# Patient Record
Sex: Female | Born: 1965 | Race: White | Hispanic: No | Marital: Married | Smoking: Former smoker
Health system: Southern US, Community
[De-identification: ages and names within clinical notes are randomized; demographics above are authoritative.]

---

## 1998-03-20 HISTORY — PX: OTHER SURGICAL HISTORY: SHX169

## 2018-02-19 DIAGNOSIS — Z23 Encounter for immunization: Secondary | ICD-10-CM | POA: Diagnosis not present

## 2018-02-19 DIAGNOSIS — Z1239 Encounter for other screening for malignant neoplasm of breast: Secondary | ICD-10-CM | POA: Diagnosis not present

## 2018-02-19 DIAGNOSIS — Z0000 Encounter for general adult medical examination without abnormal findings: Secondary | ICD-10-CM | POA: Diagnosis not present

## 2018-03-25 DIAGNOSIS — Z1231 Encounter for screening mammogram for malignant neoplasm of breast: Secondary | ICD-10-CM | POA: Diagnosis not present

## 2018-03-25 DIAGNOSIS — Z1239 Encounter for other screening for malignant neoplasm of breast: Secondary | ICD-10-CM | POA: Diagnosis not present

## 2018-05-31 DIAGNOSIS — Z23 Encounter for immunization: Secondary | ICD-10-CM | POA: Diagnosis not present

## 2018-12-25 ENCOUNTER — Encounter: Payer: Self-pay | Admitting: Family Medicine

## 2018-12-25 ENCOUNTER — Ambulatory Visit (INDEPENDENT_AMBULATORY_CARE_PROVIDER_SITE_OTHER): Payer: BC Managed Care – PPO | Admitting: Family Medicine

## 2018-12-25 ENCOUNTER — Other Ambulatory Visit: Payer: Self-pay

## 2018-12-25 VITALS — BP 128/85 | HR 81 | Temp 98.0°F | Ht 64.5 in | Wt 250.8 lb

## 2018-12-25 DIAGNOSIS — R7303 Prediabetes: Secondary | ICD-10-CM | POA: Insufficient documentation

## 2018-12-25 DIAGNOSIS — Z0000 Encounter for general adult medical examination without abnormal findings: Secondary | ICD-10-CM

## 2018-12-25 DIAGNOSIS — M2041 Other hammer toe(s) (acquired), right foot: Secondary | ICD-10-CM | POA: Insufficient documentation

## 2018-12-25 DIAGNOSIS — Z803 Family history of malignant neoplasm of breast: Secondary | ICD-10-CM | POA: Insufficient documentation

## 2018-12-25 DIAGNOSIS — Z23 Encounter for immunization: Secondary | ICD-10-CM

## 2018-12-25 DIAGNOSIS — M2042 Other hammer toe(s) (acquired), left foot: Secondary | ICD-10-CM | POA: Insufficient documentation

## 2018-12-25 LAB — CBC WITH DIFFERENTIAL/PLATELET
Basophils Absolute: 0.1 10*3/uL (ref 0.0–0.1)
Basophils Relative: 1 % (ref 0.0–3.0)
Eosinophils Absolute: 0.1 10*3/uL (ref 0.0–0.7)
Eosinophils Relative: 1.8 % (ref 0.0–5.0)
HCT: 42.6 % (ref 36.0–46.0)
Hemoglobin: 14.1 g/dL (ref 12.0–15.0)
Lymphocytes Relative: 29 % (ref 12.0–46.0)
Lymphs Abs: 1.8 10*3/uL (ref 0.7–4.0)
MCHC: 33.2 g/dL (ref 30.0–36.0)
MCV: 91.9 fl (ref 78.0–100.0)
Monocytes Absolute: 0.4 10*3/uL (ref 0.1–1.0)
Monocytes Relative: 6 % (ref 3.0–12.0)
Neutro Abs: 3.9 10*3/uL (ref 1.4–7.7)
Neutrophils Relative %: 62.2 % (ref 43.0–77.0)
Platelets: 205 10*3/uL (ref 150.0–400.0)
RBC: 4.64 Mil/uL (ref 3.87–5.11)
RDW: 13.8 % (ref 11.5–15.5)
WBC: 6.2 10*3/uL (ref 4.0–10.5)

## 2018-12-25 LAB — LIPID PANEL
Cholesterol: 181 mg/dL (ref 0–200)
HDL: 62.3 mg/dL (ref >39.00)
LDL Cholesterol: 102 mg/dL — ABNORMAL HIGH (ref 0–99)
NonHDL: 118.51
Total CHOL/HDL Ratio: 3
Triglycerides: 82 mg/dL (ref 0.0–149.0)
VLDL: 16.4 mg/dL (ref 0.0–40.0)

## 2018-12-25 LAB — HEMOGLOBIN A1C: Hgb A1c MFr Bld: 5.9 % (ref 4.6–6.5)

## 2018-12-25 LAB — COMPREHENSIVE METABOLIC PANEL
ALT: 20 U/L (ref 0–35)
AST: 12 U/L (ref 0–37)
Albumin: 4.4 g/dL (ref 3.5–5.2)
Alkaline Phosphatase: 59 U/L (ref 39–117)
BUN: 17 mg/dL (ref 6–23)
CO2: 28 mEq/L (ref 19–32)
Calcium: 9.3 mg/dL (ref 8.4–10.5)
Chloride: 106 mEq/L (ref 96–112)
Creatinine, Ser: 0.87 mg/dL (ref 0.40–1.20)
GFR: 67.55 mL/min (ref >60.00)
Glucose, Bld: 96 mg/dL (ref 70–99)
Potassium: 4 mEq/L (ref 3.5–5.1)
Sodium: 142 mEq/L (ref 135–145)
Total Bilirubin: 0.4 mg/dL (ref 0.2–1.2)
Total Protein: 7 g/dL (ref 6.0–8.3)

## 2018-12-25 LAB — VITAMIN D 25 HYDROXY (VIT D DEFICIENCY, FRACTURES): VITD: 26.54 ng/mL — ABNORMAL LOW (ref 30.00–100.00)

## 2018-12-25 LAB — TSH: TSH: 1.88 u[IU]/mL (ref 0.35–4.50)

## 2018-12-25 NOTE — Patient Instructions (Signed)
Prediabetes Eating Plan Prediabetes is a condition that causes blood sugar (glucose) levels to be higher than normal. This increases the risk for developing diabetes. In order to prevent diabetes from developing, your health care provider may recommend a diet and other lifestyle changes to help you:  Control your blood glucose levels.  Improve your cholesterol levels.  Manage your blood pressure. Your health care provider may recommend working with a diet and nutrition specialist (dietitian) to make a meal plan that is best for you. What are tips for following this plan? Lifestyle  Set weight loss goals with the help of your health care team. It is recommended that most people with prediabetes lose 7% of their current body weight.  Exercise for at least 30 minutes at least 5 days a week.  Attend a support group or seek ongoing support from a mental health counselor.  Take over-the-counter and prescription medicines only as told by your health care provider. Reading food labels  Read food labels to check the amount of fat, salt (sodium), and sugar in prepackaged foods. Avoid foods that have: ? Saturated fats. ? Trans fats. ? Added sugars.  Avoid foods that have more than 300 milligrams (mg) of sodium per serving. Limit your daily sodium intake to less than 2,300 mg each day. Shopping  Avoid buying pre-made and processed foods. Cooking  Cook with olive oil. Do not use butter, lard, or ghee.  Bake, broil, grill, or boil foods. Avoid frying. Meal planning   Work with your dietitian to develop an eating plan that is right for you. This may include: ? Tracking how many calories you take in. Use a food diary, notebook, or mobile application to track what you eat at each meal. ? Using the glycemic index (GI) to plan your meals. The index tells you how quickly a food will raise your blood glucose. Choose low-GI foods. These foods take a longer time to raise blood  glucose.  Consider following a Mediterranean diet. This diet includes: ? Several servings each day of fresh fruits and vegetables. ? Eating fish at least twice a week. ? Several servings each day of whole grains, beans, nuts, and seeds. ? Using olive oil instead of other fats. ? Moderate alcohol consumption. ? Eating small amounts of red meat and whole-fat dairy.  If you have high blood pressure, you may need to limit your sodium intake or follow a diet such as the DASH eating plan. DASH is an eating plan that aims to lower high blood pressure. What foods are recommended? The items listed below may not be a complete list. Talk with your dietitian about what dietary choices are best for you. Grains Whole grains, such as whole-wheat or whole-grain breads, crackers, cereals, and pasta. Unsweetened oatmeal. Bulgur. Barley. Quinoa. Brown rice. Corn or whole-wheat flour tortillas or taco shells. Vegetables Lettuce. Spinach. Peas. Beets. Cauliflower. Cabbage. Broccoli. Carrots. Tomatoes. Squash. Eggplant. Herbs. Peppers. Onions. Cucumbers. Brussels sprouts. Fruits Berries. Bananas. Apples. Oranges. Grapes. Papaya. Mango. Pomegranate. Kiwi. Grapefruit. Cherries. Meats and other protein foods Seafood. Poultry without skin. Lean cuts of pork and beef. Tofu. Eggs. Nuts. Beans. Dairy Low-fat or fat-free dairy products, such as yogurt, cottage cheese, and cheese. Beverages Water. Tea. Coffee. Sugar-free or diet soda. Seltzer water. Lowfat or no-fat milk. Milk alternatives, such as soy or almond milk. Fats and oils Olive oil. Canola oil. Sunflower oil. Grapeseed oil. Avocado. Walnuts. Sweets and desserts Sugar-free or low-fat pudding. Sugar-free or low-fat ice cream and other frozen  treats. Seasoning and other foods Herbs. Sodium-free spices. Mustard. Relish. Low-fat, low-sugar ketchup. Low-fat, low-sugar barbecue sauce. Low-fat or fat-free mayonnaise. What foods are not recommended? The items  listed below may not be a complete list. Talk with your dietitian about what dietary choices are best for you. Grains Refined white flour and flour products, such as bread, pasta, snack foods, and cereals. Vegetables Canned vegetables. Frozen vegetables with butter or cream sauce. Fruits Fruits canned with syrup. Meats and other protein foods Fatty cuts of meat. Poultry with skin. Breaded or fried meat. Processed meats. Dairy Full-fat yogurt, cheese, or milk. Beverages Sweetened drinks, such as sweet iced tea and soda. Fats and oils Butter. Lard. Ghee. Sweets and desserts Baked goods, such as cake, cupcakes, pastries, cookies, and cheesecake. Seasoning and other foods Spice mixes with added salt. Ketchup. Barbecue sauce. Mayonnaise. Summary  To prevent diabetes from developing, you may need to make diet and other lifestyle changes to help control blood sugar, improve cholesterol levels, and manage your blood pressure.  Set weight loss goals with the help of your health care team. It is recommended that most people with prediabetes lose 7 percent of their current body weight.  Consider following a Mediterranean diet that includes plenty of fresh fruits and vegetables, whole grains, beans, nuts, seeds, fish, lean meat, low-fat dairy, and healthy oils. This information is not intended to replace advice given to you by your health care provider. Make sure you discuss any questions you have with your health care provider. Document Released: 07/21/2014 Document Revised: 06/28/2018 Document Reviewed: 05/10/2016 Elsevier Patient Education  2020 [employer]  Preventive Care 44-45 Years Old, Female Preventive care refers to visits with your health care provider and lifestyle choices that can promote health and wellness. This includes:  A yearly physical exam. This may also be called an annual well check.  Regular dental visits and eye exams.  Immunizations.  Screening for certain  conditions.  Healthy lifestyle choices, such as eating a healthy diet, getting regular exercise, not using drugs or products that contain nicotine and tobacco, and limiting alcohol use. What can I expect for my preventive care visit? Physical exam Your health care provider will check your:  Height and weight. This may be used to calculate body mass index (BMI), which tells if you are at a healthy weight.  Heart rate and blood pressure.  Skin for abnormal spots. Counseling Your health care provider may ask you questions about your:  Alcohol, tobacco, and drug use.  Emotional well-being.  Home and relationship well-being.  Sexual activity.  Eating habits.  Work and work Statistician.  Method of birth control.  Menstrual cycle.  Pregnancy history. What immunizations do I need?  Influenza (flu) vaccine  This is recommended every year. Tetanus, diphtheria, and pertussis (Tdap) vaccine  You may need a Td booster every 10 years. Varicella (chickenpox) vaccine  You may need this if you have not been vaccinated. Zoster (shingles) vaccine  You may need this after age 53. Measles, mumps, and rubella (MMR) vaccine  You may need at least one dose of MMR if you were born in 1957 or later. You may also need a second dose. Pneumococcal conjugate (PCV13) vaccine  You may need this if you have certain conditions and were not previously vaccinated. Pneumococcal polysaccharide (PPSV23) vaccine  You may need one or two doses if you smoke cigarettes or if you have certain conditions. Meningococcal conjugate (MenACWY) vaccine  You may need this if you have certain  conditions. Hepatitis A vaccine  You may need this if you have certain conditions or if you travel or work in places where you may be exposed to hepatitis A. Hepatitis B vaccine  You may need this if you have certain conditions or if you travel or work in places where you may be exposed to hepatitis B. Haemophilus  influenzae type b (Hib) vaccine  You may need this if you have certain conditions. Human papillomavirus (HPV) vaccine  If recommended by your health care provider, you may need three doses over 6 months. You may receive vaccines as individual doses or as more than one vaccine together in one shot (combination vaccines). Talk with your health care provider about the risks and benefits of combination vaccines. What tests do I need? Blood tests  Lipid and cholesterol levels. These may be checked every 5 years, or more frequently if you are over 1 years old.  Hepatitis C test.  Hepatitis B test. Screening  Lung cancer screening. You may have this screening every year starting at age 54 if you have a 30-pack-year history of smoking and currently smoke or have quit within the past 15 years.  Colorectal cancer screening. All adults should have this screening starting at age 43 and continuing until age 37. Your health care provider may recommend screening at age 67 if you are at increased risk. You will have tests every 1-10 years, depending on your results and the type of screening test.  Diabetes screening. This is done by checking your blood sugar (glucose) after you have not eaten for a while (fasting). You may have this done every 1-3 years.  Mammogram. This may be done every 1-2 years. Talk with your health care provider about when you should start having regular mammograms. This may depend on whether you have a family history of breast cancer.  BRCA-related cancer screening. This may be done if you have a family history of breast, ovarian, tubal, or peritoneal cancers.  Pelvic exam and Pap test. This may be done every 3 years starting at age 79. Starting at age 42, this may be done every 5 years if you have a Pap test in combination with an HPV test. Other tests  Sexually transmitted disease (STD) testing.  Bone density scan. This is done to screen for osteoporosis. You may have this  scan if you are at high risk for osteoporosis. Follow these instructions at home: Eating and drinking  Eat a diet that includes fresh fruits and vegetables, whole grains, lean protein, and low-fat dairy.  Take vitamin and mineral supplements as recommended by your health care provider.  Do not drink alcohol if: ? Your health care provider tells you not to drink. ? You are pregnant, may be pregnant, or are planning to become pregnant.  If you drink alcohol: ? Limit how much you have to 0-1 drink a day. ? Be aware of how much alcohol is in your drink. In the [place] one drink equals one 12 oz bottle of beer (355 mL), one 5 oz glass of wine (148 mL), or one 1 oz glass of hard liquor (44 mL). Lifestyle  Take daily care of your teeth and gums.  Stay active. Exercise for at least 30 minutes on 5 or more days each week.  Do not use any products that contain nicotine or tobacco, such as cigarettes, e-cigarettes, and chewing tobacco. If you need help quitting, ask your health care provider.  If you are sexually active, practice safe  sex. Use a condom or other form of birth control (contraception) in order to prevent pregnancy and STIs (sexually transmitted infections).  If told by your health care provider, take low-dose aspirin daily starting at age 61. What's next?  Visit your health care provider once a year for a well check visit.  Ask your health care provider how often you should have your eyes and teeth checked.  Stay up to date on all vaccines. This information is not intended to replace advice given to you by your health care provider. Make sure you discuss any questions you have with your health care provider. Document Released: 04/02/2015 Document Revised: 11/15/2017 Document Reviewed: 11/15/2017 Elsevier Patient Education  2020 [employer].

## 2018-12-25 NOTE — Progress Notes (Signed)
Patient: Susan Eaton MRN: [id number] DOB: 1965/12/15 PCP: Orma Flaming, MD     Subjective:  Chief Complaint  Patient presents with  . Establish Care  . Annual Exam    HPI: The patient is a 53 y.o. female who presents today for annual exam. She denies any changes to past medical history. There have been no recent hospitalizations. They are not following a well balanced diet and exercise plan. Weight has been stable. No complaints today. She is transferring from [place]. Records in our epic. She is menopausal.   Family history of breast cancer in her mother. BRCA negative in patient. No colon cancer.   Immunization History  Administered Date(s) Administered  . Influenza,inj,Quad PF,6+ Mos 01/21/2015, 02/19/2018  . Tdap 01/21/2015  . Zoster Recombinat (Shingrix) 02/19/2018, 05/31/2018    Colonoscopy: 07/12/2015. F/u in 10 years.  Mammogram: utd. 02/2018 Pap smear: looking into this. See 2016 in her other notes.  utd tdap and shingrix. Flu shot today.    Review of Systems  Constitutional: Negative for chills, fatigue and fever.  HENT: Negative for congestion, dental problem, ear pain, hearing loss, postnasal drip, rhinorrhea, sore throat and trouble swallowing.   Eyes: Negative for visual disturbance.  Respiratory: Negative for cough, chest tightness and shortness of breath.   Cardiovascular: Negative for chest pain, palpitations and leg swelling.  Gastrointestinal: Negative for abdominal pain, blood in stool, diarrhea, nausea and vomiting.  Endocrine: Negative for cold intolerance, polydipsia, polyphagia and polyuria.  Genitourinary: Negative for dysuria, hematuria, pelvic pain, urgency and vaginal pain.  Musculoskeletal: Negative for arthralgias, back pain, myalgias and neck pain.  Skin: Negative for rash.  Neurological: Negative for dizziness and headaches.  Psychiatric/Behavioral: Negative for dysphoric mood and sleep disturbance. The patient is not nervous/anxious.      Allergies Patient has No Known Allergies.  Past Medical History Patient  has no past medical history on file.  Surgical History Patient  has a past surgical history that includes prolapsed colon (2000).  Family History Pateint's family history is not on file.  Social History Patient  reports that she has quit smoking. She has never used smokeless tobacco. She reports current alcohol use. She reports that she does not use drugs.    Objective: Vitals:   12/25/18 1321  BP: 128/85  Pulse: 81  Temp: 98 F (36.7 C)  TempSrc: Skin  SpO2: 98%  Weight: 250 lb 12.8 oz (113.8 kg)  Height: 5' 4.5" (1.638 m)    Body mass index is 42.38 kg/m.  Physical Exam Vitals signs reviewed.  Constitutional:      Appearance: Normal appearance. She is well-developed. She is obese.  HENT:     Head: Normocephalic and atraumatic.     Right Ear: Tympanic membrane, ear canal and external ear normal.     Left Ear: Tympanic membrane, ear canal and external ear normal.     Nose: Nose normal.     Mouth/Throat:     Mouth: Mucous membranes are moist.  Eyes:     Extraocular Movements: Extraocular movements intact.     Conjunctiva/sclera: Conjunctivae normal.     Pupils: Pupils are equal, round, and reactive to light.  Neck:     Musculoskeletal: Normal range of motion and neck supple.     Thyroid: No thyromegaly.  Cardiovascular:     Rate and Rhythm: Normal rate and regular rhythm.     Pulses: Normal pulses.     Heart sounds: Normal heart sounds. No murmur.  Pulmonary:  Effort: Pulmonary effort is normal.     Breath sounds: Normal breath sounds.  Abdominal:     General: Bowel sounds are normal. There is no distension.     Palpations: Abdomen is soft.     Tenderness: There is no abdominal tenderness.  Lymphadenopathy:     Cervical: No cervical adenopathy.  Skin:    General: Skin is warm and dry.     Capillary Refill: Capillary refill takes less than 2 seconds.     Findings: No rash.   Neurological:     General: No focal deficit present.     Mental Status: She is alert and oriented to person, place, and time.     Cranial Nerves: No cranial nerve deficit.     Coordination: Coordination normal.     Deep Tendon Reflexes: Reflexes normal.  Psychiatric:        Mood and Affect: Mood normal.        Behavior: Behavior normal.        Depression screen PHQ 2/9 12/25/2018  Decreased Interest 0  Down, Depressed, Hopeless 0  PHQ - 2 Score 0    Assessment/plan: 1. Annual physical exam UTD on HM. Giving flu shot today. Asked her to check her pap smear and see if she needs this done and we can do this here. Recommended healthy diet and weight loss. Watch diastolic bp. F/u in one year or as needed. Records are in chart. Need mmg, requesting these.  Patient counseling '[x]'    Nutrition: Stressed importance of moderation in sodium/caffeine intake, saturated fat and cholesterol, caloric balance, sufficient intake of fresh fruits, vegetables, fiber, calcium, iron, and 1 mg of folate supplement per day (for females capable of pregnancy).  '[x]'    Stressed the importance of regular exercise.   '[]'    Substance Abuse: Discussed cessation/primary prevention of tobacco, alcohol, or other drug use; driving or other dangerous activities under the influence; availability of treatment for abuse.   '[x]'    Injury prevention: Discussed safety belts, safety helmets, smoke detector, smoking near bedding or upholstery.   '[x]'    Sexuality: Discussed sexually transmitted diseases, partner selection, use of condoms, avoidance of unintended pregnancy  and contraceptive alternatives.  '[x]'    Dental health: Discussed importance of regular tooth brushing, flossing, and dental visits.  '[x]'    Health maintenance and immunizations reviewed. Please refer to Health maintenance section.    - Comprehensive metabolic panel - CBC with Differential/Platelet - TSH - Lipid panel - VITAMIN D 25 Hydroxy (Vit-D Deficiency,  Fractures)  2. Prediabetes  - Hemoglobin A1c  -flu shot.   Return in about 6 months (around 06/25/2019) for pre diabetes checkup .     Orma Flaming, MD [place]  12/25/2018

## 2018-12-26 ENCOUNTER — Other Ambulatory Visit: Payer: Self-pay | Admitting: Family Medicine

## 2018-12-26 DIAGNOSIS — E559 Vitamin D deficiency, unspecified: Secondary | ICD-10-CM | POA: Insufficient documentation

## 2018-12-26 MED ORDER — VITAMIN D (ERGOCALCIFEROL) 1.25 MG (50000 UNIT) PO CAPS: ORAL_CAPSULE | ORAL | 0 refills | Status: DC

## 2019-02-18 ENCOUNTER — Other Ambulatory Visit: Payer: Self-pay | Admitting: Family Medicine

## 2019-11-05 ENCOUNTER — Encounter: Payer: Self-pay | Admitting: Family Medicine

## 2019-11-05 ENCOUNTER — Other Ambulatory Visit: Payer: Self-pay | Admitting: Family Medicine

## 2019-11-05 ENCOUNTER — Other Ambulatory Visit (HOSPITAL_COMMUNITY)
Admission: RE | Admit: 2019-11-05 | Discharge: 2019-11-05 | Disposition: A | Discharge status: Home / Self Care | Payer: BC Managed Care – PPO | Source: Ambulatory Visit | Attending: Family Medicine | Admitting: Family Medicine

## 2019-11-05 ENCOUNTER — Other Ambulatory Visit: Payer: Self-pay

## 2019-11-05 ENCOUNTER — Ambulatory Visit (INDEPENDENT_AMBULATORY_CARE_PROVIDER_SITE_OTHER): Payer: BC Managed Care – PPO | Admitting: Family Medicine

## 2019-11-05 VITALS — BP 119/76 | HR 83 | Temp 98.1°F | Ht 63.5 in | Wt 261.6 lb

## 2019-11-05 DIAGNOSIS — Z0000 Encounter for general adult medical examination without abnormal findings: Secondary | ICD-10-CM

## 2019-11-05 DIAGNOSIS — E559 Vitamin D deficiency, unspecified: Secondary | ICD-10-CM | POA: Diagnosis not present

## 2019-11-05 DIAGNOSIS — N3941 Urge incontinence: Secondary | ICD-10-CM | POA: Diagnosis not present

## 2019-11-05 DIAGNOSIS — Z1231 Encounter for screening mammogram for malignant neoplasm of breast: Secondary | ICD-10-CM

## 2019-11-05 DIAGNOSIS — Z124 Encounter for screening for malignant neoplasm of cervix: Secondary | ICD-10-CM

## 2019-11-05 DIAGNOSIS — R7303 Prediabetes: Secondary | ICD-10-CM

## 2019-11-05 DIAGNOSIS — E28319 Asymptomatic premature menopause: Secondary | ICD-10-CM

## 2019-11-05 MED ORDER — MIRABEGRON ER 25 MG PO TB24: 25.0000 mg | ORAL_TABLET | Freq: Every day | ORAL | 0 refills | Status: DC

## 2019-11-05 NOTE — Progress Notes (Signed)
Patient: Susan Eaton MRN: [id number] DOB: February 27, 1966 PCP: Orma Flaming, MD     Subjective:  Chief Complaint  Patient presents with  . Annual Exam  . Urinary Urgency    HPI: The patient is a 54 y.o. female who presents today for annual exam. She denies any changes to past medical history. There have been no recent hospitalizations. They are not following a well balanced diet and exercise plan. Weight has been stable. She has bladder control concerns..   Family history of breast cancer in her mother. BRCA negative for patient. No colon cancer.   Due for pap smear. No history of abnormal pap smears or STDs that she is aware of. NO vaginal complaints and denies any discharge, pain with sex. She is sexually active with one person only, her husband. Mmg is due and utd on colonoscopy. She was young with menopause and never did HRT. She thinks she was in her 20s. G2P2  Vaginal deliveries.   She has some bladder concerns. She states she will get to where she has to go to the bathroom and then can't make it there fast enough. She does have very mild stress incontinence. This is more she will be doing things and have no idea she has to go to the bathroom and then all of a sudden she has to go and then leaks on herself.   Immunization History  Administered Date(s) Administered  . Influenza,inj,Quad PF,6+ Mos 01/21/2015, 02/19/2018, 12/25/2018  . PFIZER SARS-COV-2 Vaccination 06/14/2019, 07/02/2019  . Tdap 01/21/2015  . Zoster Recombinat (Shingrix) 02/19/2018, 05/31/2018   Colonoscopy: 07/12/2015 Mammogram: due for this.  Pap smear:  Due for this.    Review of Systems  Respiratory: Negative for shortness of breath.   Cardiovascular: Negative for chest pain and palpitations.  Gastrointestinal: Negative for abdominal pain, diarrhea and nausea.  Genitourinary: Positive for urgency. Negative for frequency and pelvic pain.  Musculoskeletal: Negative for back pain.    Allergies Patient  has No Known Allergies.  Past Medical History Patient  has no past medical history on file.  Surgical History Patient  has a past surgical history that includes prolapsed colon (2000).  Family History Pateint's family history is not on file.  Social History Patient  reports that she has quit smoking. She has never used smokeless tobacco. She reports current alcohol use. She reports that she does not use drugs.    Objective: Vitals:   11/05/19 1355  BP: 119/76  Pulse: 83  Temp: 98.1 F (36.7 C)  TempSrc: Temporal  SpO2: 98%  Weight: 261 lb 9.6 oz (118.7 kg)  Height: 5' 3.5" (1.613 m)    Body mass index is 45.61 kg/m.  Physical Exam Vitals reviewed. Exam conducted with a chaperone present.  Constitutional:      Appearance: Normal appearance. She is well-developed. She is obese.  HENT:     Head: Normocephalic and atraumatic.     Right Ear: Tympanic membrane, ear canal and external ear normal.     Left Ear: Tympanic membrane, ear canal and external ear normal.     Nose: Nose normal.     Mouth/Throat:     Mouth: Mucous membranes are moist.  Eyes:     Extraocular Movements: Extraocular movements intact.     Conjunctiva/sclera: Conjunctivae normal.     Pupils: Pupils are equal, round, and reactive to light.  Neck:     Thyroid: No thyromegaly.     Vascular: No carotid bruit.  Cardiovascular:  Rate and Rhythm: Normal rate and regular rhythm.     Pulses: Normal pulses.     Heart sounds: Normal heart sounds. No murmur heard.   Pulmonary:     Effort: Pulmonary effort is normal.     Breath sounds: Normal breath sounds.  Chest:     Comments: Declined breast exam  Abdominal:     General: Bowel sounds are normal. There is no distension.     Palpations: Abdomen is soft.     Tenderness: There is no abdominal tenderness.  Genitourinary:    General: Normal vulva.     Cervix: Normal.     Uterus: Normal.      Adnexa: Right adnexa normal and left adnexa normal.   Musculoskeletal:     Cervical back: Normal range of motion and neck supple.  Lymphadenopathy:     Cervical: No cervical adenopathy.  Skin:    General: Skin is warm and dry.     Capillary Refill: Capillary refill takes less than 2 seconds.     Findings: No rash.  Neurological:     General: No focal deficit present.     Mental Status: She is alert and oriented to person, place, and time.     Cranial Nerves: No cranial nerve deficit.     Coordination: Coordination normal.     Deep Tendon Reflexes: Reflexes normal.  Psychiatric:        Mood and Affect: Mood normal.        Behavior: Behavior normal.       Office Visit from 11/05/2019 in [place]  PHQ-2 Total Score 0         Assessment/plan: 1. Annual physical exam Routine labs today. She is not fasting. HM reviewed. Pap smear today and mmg information given. Hx of possible early menopause so ordering dexa as well. Otherwise HM is UTD. Encouraged exercise and weight loss. F/u in one year or as needed.  Patient counseling '[x]'    Nutrition: Stressed importance of moderation in sodium/caffeine intake, saturated fat and cholesterol, caloric balance, sufficient intake of fresh fruits, vegetables, fiber, calcium, iron, and 1 mg of folate supplement per day (for females capable of pregnancy).  '[x]'    Stressed the importance of regular exercise.   '[]'    Substance Abuse: Discussed cessation/primary prevention of tobacco, alcohol, or other drug use; driving or other dangerous activities under the influence; availability of treatment for abuse.   '[x]'    Injury prevention: Discussed safety belts, safety helmets, smoke detector, smoking near bedding or upholstery.   '[x]'    Sexuality: Discussed sexually transmitted diseases, partner selection, use of condoms, avoidance of unintended pregnancy  and contraceptive alternatives.  '[x]'    Dental health: Discussed importance of regular tooth brushing, flossing, and dental visits.  '[x]'     Health maintenance and immunizations reviewed. Please refer to Health maintenance section.    - Comprehensive metabolic panel; Future - CBC with Differential/Platelet; Future - TSH; Future - Lipid panel; Future - Hemoglobin A1c; Future  2. Pap smear for cervical cancer screening  - Cytology - PAP( [place])  3. Urge incontinence Trial of myrbetriq. Sides effects discussed. Encouraged weight loss. Will let me know in a month how she likes this or if we need to increase dosage.   4. Vitamin D deficiency  - VITAMIN D 25 Hydroxy (Vit-D Deficiency, Fractures); Future  5. Prediabetes  - Hemoglobin A1c; Future  6. Early menopause  - DG Bone Density; Future  This visit occurred during the SARS-CoV-2  public health emergency.  Safety protocols were in place, including screening questions prior to the visit, additional usage of staff PPE, and extensive cleaning of exam room while observing appropriate contact time as indicated for disinfecting solutions.      Return in about 1 year (around 11/04/2020) for annual/fasting labs .     Orma Flaming, MD [place]  11/05/2019

## 2019-11-05 NOTE — Patient Instructions (Addendum)
-for bladder urgency will do trial with drug called myrbetriq. Call me in a month and we can increase dosage if needed. Takes a month to really work.   Urinary Incontinence  Urinary incontinence refers to a condition in which a person is unable to control where and when to pass urine. A person with this condition will urinate when he or she does not mean to (involuntarily). What are the causes? This condition may be caused by:  Medicines.  Infections.  Constipation.  Overactive bladder muscles.  Weak bladder muscles.  Weak pelvic floor muscles. These muscles provide support for the bladder, intestine, and, in women, the uterus.  Enlarged prostate in men. The prostate is a gland near the bladder. When it gets too big, it can pinch the urethra. With the urethra blocked, the bladder can weaken and lose the ability to empty properly.  Surgery.  Emotional factors, such as anxiety, stress, or post-traumatic stress disorder (PTSD).  Pelvic organ prolapse. This happens in women when organs shift out of place and into the vagina. This shift can prevent the bladder and urethra from working properly. What increases the risk? The following factors may make you more likely to develop this condition:  Older age.  Obesity and physical inactivity.  Pregnancy and childbirth.  Menopause.  Diseases that affect the nerves or spinal cord (neurological diseases).  Long-term (chronic) coughing. This can increase pressure on the bladder and pelvic floor muscles. What are the signs or symptoms? Symptoms may vary depending on the type of urinary incontinence you have. They include:  A sudden urge to urinate, but passing urine involuntarily before you can get to a bathroom (urge incontinence).  Suddenly passing urine with any activity that forces urine to pass, such as coughing, laughing, exercise, or sneezing (stress incontinence).  Needing to urinate often, but urinating only a small amount,  or constantly dribbling urine (overflow incontinence).  Urinating because you cannot get to the bathroom in time due to a physical disability, such as arthritis or injury, or communication and thinking problems, such as Alzheimer disease (functional incontinence). How is this diagnosed? This condition may be diagnosed based on:  Your medical history.  A physical exam.  Tests, such as: ? Urine tests. ? X-rays of your kidney and bladder. ? Ultrasound. ? CT scan. ? Cystoscopy. In this procedure, a health care provider inserts a tube with a light and camera (cystoscope) through the urethra and into the bladder in order to check for problems. ? Urodynamic testing. These tests assess how well the bladder, urethra, and sphincter can store and release urine. There are different types of urodynamic tests, and they vary depending on what the test is measuring. To help diagnose your condition, your health care provider may recommend that you keep a log of when you urinate and how much you urinate. How is this treated? Treatment for this condition depends on the type of incontinence that you have and its cause. Treatment may include:  Lifestyle changes, such as: ? Quitting smoking. ? Maintaining a healthy weight. ? Staying active. Try to get 150 minutes of moderate-intensity exercise every week. Ask your health care provider which activities are safe for you. ? Eating a healthy diet.  Avoid high-fat foods, like fried foods.  Avoid refined carbohydrates like white bread and white rice.  Limit how much alcohol and caffeine you drink.  Increase your fiber intake. Foods such as fresh fruits, vegetables, beans, and whole grains are healthy sources of fiber.  Pelvic  floor muscle exercises.  Bladder training, such as lengthening the amount of time between bathroom breaks, or using the bathroom at regular intervals.  Using techniques to suppress bladder urges. This can include distraction  techniques or controlled breathing exercises.  Medicines to relax the bladder muscles and prevent bladder spasms.  Medicines to help slow or prevent the growth of a man's prostate.  Botox injections. These can help relax the bladder muscles.  Using pulses of electricity to help change bladder reflexes (electrical nerve stimulation).  For women, using a medical device to prevent urine leaks. This is a small, tampon-like, disposable device that is inserted into the urethra.  Injecting collagen or carbon beads (bulking agents) into the urinary sphincter. These can help thicken tissue and close the bladder opening.  Surgery. Follow these instructions at home: Lifestyle  Limit alcohol and caffeine. These can fill your bladder quickly and irritate it.  Keep yourself clean to help prevent odors and skin damage. Ask your doctor about special skin creams and cleansers that can protect the skin from urine.  Consider wearing pads or adult diapers. Make sure to change them regularly, and always change them right after experiencing incontinence. General instructions  Take over-the-counter and prescription medicines only as told by your health care provider.  Use the bathroom about every 3-4 hours, even if you do not feel the need to urinate. Try to empty your bladder completely every time. After urinating, wait a minute. Then try to urinate again.  Make sure you are in a relaxed position while urinating.  If your incontinence is caused by nerve problems, keep a log of the medicines you take and the times you go to the bathroom.  Keep all follow-up visits as told by your health care provider. This is important. Contact a health care provider if:  You have pain that gets worse.  Your incontinence gets worse. Get help right away if:  You have a fever or chills.  You are unable to urinate.  You have redness in your groin area or down your legs. Summary  Urinary incontinence refers to a  condition in which a person is unable to control where and when to pass urine.  This condition may be caused by medicines, infection, weak bladder muscles, weak pelvic floor muscles, enlargement of the prostate (in men), or surgery.  The following factors increase your risk for developing this condition: older age, obesity, pregnancy and childbirth, menopause, neurological diseases, and chronic coughing.  There are several types of urinary incontinence. They include urge incontinence, stress incontinence, overflow incontinence, and functional incontinence.  This condition is usually treated first with lifestyle and behavioral changes, such as quitting smoking, eating a healthier diet, and doing regular pelvic floor exercises. Other treatment options include medicines, bulking agents, medical devices, electrical nerve stimulation, or surgery. This information is not intended to replace advice given to you by your health care provider. Make sure you discuss any questions you have with your health care provider. Document Revised: 03/16/2017 Document Reviewed: 06/15/2016 Elsevier Patient Education  2020 [employer] Inc[employer]     Preventive Care 59-43 Years Old, Female Preventive care refers to visits with your health care provider and lifestyle choices that can promote health and wellness. This includes:  A yearly physical exam. This may also be called an annual well check.  Regular dental visits and eye exams.  Immunizations.  Screening for certain conditions.  Healthy lifestyle choices, such as eating a healthy diet, getting regular exercise, not using drugs  or products that contain nicotine and tobacco, and limiting alcohol use. What can I expect for my preventive care visit? Physical exam Your health care provider will check your:  Height and weight. This may be used to calculate body mass index (BMI), which tells if you are at a healthy weight.  Heart rate and blood pressure.  Skin for  abnormal spots. Counseling Your health care provider may ask you questions about your:  Alcohol, tobacco, and drug use.  Emotional well-being.  Home and relationship well-being.  Sexual activity.  Eating habits.  Work and work Statistician.  Method of birth control.  Menstrual cycle.  Pregnancy history. What immunizations do I need?  Influenza (flu) vaccine  This is recommended every year. Tetanus, diphtheria, and pertussis (Tdap) vaccine  You may need a Td booster every 10 years. Varicella (chickenpox) vaccine  You may need this if you have not been vaccinated. Zoster (shingles) vaccine  You may need this after age 58. Measles, mumps, and rubella (MMR) vaccine  You may need at least one dose of MMR if you were born in 1957 or later. You may also need a second dose. Pneumococcal conjugate (PCV13) vaccine  You may need this if you have certain conditions and were not previously vaccinated. Pneumococcal polysaccharide (PPSV23) vaccine  You may need one or two doses if you smoke cigarettes or if you have certain conditions. Meningococcal conjugate (MenACWY) vaccine  You may need this if you have certain conditions. Hepatitis A vaccine  You may need this if you have certain conditions or if you travel or work in places where you may be exposed to hepatitis A. Hepatitis B vaccine  You may need this if you have certain conditions or if you travel or work in places where you may be exposed to hepatitis B. Haemophilus influenzae type b (Hib) vaccine  You may need this if you have certain conditions. Human papillomavirus (HPV) vaccine  If recommended by your health care provider, you may need three doses over 6 months. You may receive vaccines as individual doses or as more than one vaccine together in one shot (combination vaccines). Talk with your health care provider about the risks and benefits of combination vaccines. What tests do I need? Blood tests  Lipid  and cholesterol levels. These may be checked every 5 years, or more frequently if you are over 51 years old.  Hepatitis C test.  Hepatitis B test. Screening  Lung cancer screening. You may have this screening every year starting at age 33 if you have a 30-pack-year history of smoking and currently smoke or have quit within the past 15 years.  Colorectal cancer screening. All adults should have this screening starting at age 52 and continuing until age 69. Your health care provider may recommend screening at age 76 if you are at increased risk. You will have tests every 1-10 years, depending on your results and the type of screening test.  Diabetes screening. This is done by checking your blood sugar (glucose) after you have not eaten for a while (fasting). You may have this done every 1-3 years.  Mammogram. This may be done every 1-2 years. Talk with your health care provider about when you should start having regular mammograms. This may depend on whether you have a family history of breast cancer.  BRCA-related cancer screening. This may be done if you have a family history of breast, ovarian, tubal, or peritoneal cancers.  Pelvic exam and Pap test. This may be  done every 3 years starting at age 37. Starting at age 27, this may be done every 5 years if you have a Pap test in combination with an HPV test. Other tests  Sexually transmitted disease (STD) testing.  Bone density scan. This is done to screen for osteoporosis. You may have this scan if you are at high risk for osteoporosis. Follow these instructions at home: Eating and drinking  Eat a diet that includes fresh fruits and vegetables, whole grains, lean protein, and low-fat dairy.  Take vitamin and mineral supplements as recommended by your health care provider.  Do not drink alcohol if: ? Your health care provider tells you not to drink. ? You are pregnant, may be pregnant, or are planning to become pregnant.  If you drink  alcohol: ? Limit how much you have to 0-1 drink a day. ? Be aware of how much alcohol is in your drink. In the [place] one drink equals one 12 oz bottle of beer (355 mL), one 5 oz glass of wine (148 mL), or one 1 oz glass of hard liquor (44 mL). Lifestyle  Take daily care of your teeth and gums.  Stay active. Exercise for at least 30 minutes on 5 or more days each week.  Do not use any products that contain nicotine or tobacco, such as cigarettes, e-cigarettes, and chewing tobacco. If you need help quitting, ask your health care provider.  If you are sexually active, practice safe sex. Use a condom or other form of birth control (contraception) in order to prevent pregnancy and STIs (sexually transmitted infections).  If told by your health care provider, take low-dose aspirin daily starting at age 83. What's next?  Visit your health care provider once a year for a well check visit.  Ask your health care provider how often you should have your eyes and teeth checked.  Stay up to date on all vaccines. This information is not intended to replace advice given to you by your health care provider. Make sure you discuss any questions you have with your health care provider. Document Revised: 11/15/2017 Document Reviewed: 11/15/2017 Elsevier Patient Education  2020 [employer].

## 2019-11-06 LAB — CBC WITH DIFFERENTIAL/PLATELET
Absolute Monocytes: 442 cells/uL (ref 200–950)
Basophils Absolute: 48 cells/uL (ref 0–200)
Basophils Relative: 0.7 %
Eosinophils Absolute: 109 cells/uL (ref 15–500)
Eosinophils Relative: 1.6 %
HCT: 42.7 % (ref 35.0–45.0)
Hemoglobin: 14.3 g/dL (ref 11.7–15.5)
Lymphs Abs: 1652 cells/uL (ref 850–3900)
MCH: 30.4 pg (ref 27.0–33.0)
MCHC: 33.5 g/dL (ref 32.0–36.0)
MCV: 90.9 fL (ref 80.0–100.0)
MPV: 11.4 fL (ref 7.5–12.5)
Monocytes Relative: 6.5 %
Neutro Abs: 4549 cells/uL (ref 1500–7800)
Neutrophils Relative %: 66.9 %
Platelets: 230 10*3/uL (ref 140–400)
RBC: 4.7 10*6/uL (ref 3.80–5.10)
RDW: 12.8 % (ref 11.0–15.0)
Total Lymphocyte: 24.3 %
WBC: 6.8 10*3/uL (ref 3.8–10.8)

## 2019-11-06 LAB — LIPID PANEL
Cholesterol: 200 mg/dL — ABNORMAL HIGH (ref <200)
HDL: 60 mg/dL (ref >=50)
LDL Cholesterol (Calc): 117 mg/dL (calc) — ABNORMAL HIGH
Non-HDL Cholesterol (Calc): 140 mg/dL (calc) — ABNORMAL HIGH (ref <130)
Total CHOL/HDL Ratio: 3.3 (calc) (ref <5.0)
Triglycerides: 120 mg/dL (ref <150)

## 2019-11-06 LAB — COMPREHENSIVE METABOLIC PANEL
AG Ratio: 1.7 (calc) (ref 1.0–2.5)
ALT: 21 U/L (ref 6–29)
AST: 12 U/L (ref 10–35)
Albumin: 4.5 g/dL (ref 3.6–5.1)
Alkaline phosphatase (APISO): 57 U/L (ref 37–153)
BUN: 17 mg/dL (ref 7–25)
CO2: 27 mmol/L (ref 20–32)
Calcium: 9.6 mg/dL (ref 8.6–10.4)
Chloride: 103 mmol/L (ref 98–110)
Creat: 0.84 mg/dL (ref 0.50–1.05)
Globulin: 2.6 g/dL (calc) (ref 1.9–3.7)
Glucose, Bld: 98 mg/dL (ref 65–99)
Potassium: 4.3 mmol/L (ref 3.5–5.3)
Sodium: 140 mmol/L (ref 135–146)
Total Bilirubin: 0.5 mg/dL (ref 0.2–1.2)
Total Protein: 7.1 g/dL (ref 6.1–8.1)

## 2019-11-06 LAB — HEMOGLOBIN A1C
Hgb A1c MFr Bld: 5.8 % of total Hgb — ABNORMAL HIGH (ref <5.7)
Mean Plasma Glucose: 120 (calc)
eAG (mmol/L): 6.6 (calc)

## 2019-11-06 LAB — CYTOLOGY - PAP
Comment: NEGATIVE
Diagnosis: NEGATIVE
High risk HPV: NEGATIVE

## 2019-11-06 LAB — TSH: TSH: 2.07 mIU/L (ref 0.40–4.50)

## 2019-11-06 LAB — VITAMIN D 25 HYDROXY (VIT D DEFICIENCY, FRACTURES): Vit D, 25-Hydroxy: 22 ng/mL — ABNORMAL LOW (ref 30–100)

## 2019-11-07 ENCOUNTER — Other Ambulatory Visit: Payer: Self-pay | Admitting: Family Medicine

## 2019-11-07 MED ORDER — VITAMIN D (ERGOCALCIFEROL) 1.25 MG (50000 UNIT) PO CAPS: ORAL_CAPSULE | ORAL | 0 refills | Status: DC

## 2019-11-17 ENCOUNTER — Telehealth: Payer: Self-pay | Admitting: Family Medicine

## 2019-11-17 NOTE — Telephone Encounter (Signed)
FYI

## 2019-11-17 NOTE — Telephone Encounter (Signed)
mirabegron ER (MYRBETRIQ) 25 MG TB24 tablet  Patient called in and stated this prescription was $240 and she could not afford it and wanted to know if there was another medication  that could be sent in.

## 2019-11-19 MED ORDER — TOLTERODINE TARTRATE ER 2 MG PO CP24: 2.0000 mg | ORAL_CAPSULE | Freq: Every day | ORAL | 1 refills | Status: DC

## 2019-11-19 NOTE — Telephone Encounter (Signed)
Sent in a drug called detrol xl. Will take daily. Starting low dose at 2mg . Should be covered under her insurance.  Dr. 

## 2019-11-19 NOTE — Telephone Encounter (Signed)
Spoke with pt to give message below. She voiced understanding.

## 2019-11-30 ENCOUNTER — Other Ambulatory Visit: Payer: Self-pay | Admitting: Family Medicine

## 2019-12-23 ENCOUNTER — Other Ambulatory Visit: Payer: Self-pay | Admitting: Family Medicine

## 2020-01-13 ENCOUNTER — Other Ambulatory Visit: Payer: Self-pay | Admitting: Family Medicine

## 2020-02-04 ENCOUNTER — Ambulatory Visit
Admission: RE | Admit: 2020-02-04 | Discharge: 2020-02-04 | Disposition: A | Discharge status: Home / Self Care | Payer: BC Managed Care – PPO | Source: Ambulatory Visit | Attending: Family Medicine | Admitting: Family Medicine

## 2020-02-04 ENCOUNTER — Other Ambulatory Visit: Payer: Self-pay

## 2020-02-04 DIAGNOSIS — Z1231 Encounter for screening mammogram for malignant neoplasm of breast: Secondary | ICD-10-CM | POA: Diagnosis not present

## 2020-02-04 DIAGNOSIS — Z13820 Encounter for screening for osteoporosis: Secondary | ICD-10-CM | POA: Diagnosis not present

## 2020-02-04 DIAGNOSIS — E28319 Asymptomatic premature menopause: Secondary | ICD-10-CM

## 2020-02-04 DIAGNOSIS — Z780 Asymptomatic menopausal state: Secondary | ICD-10-CM | POA: Diagnosis not present

## 2020-02-07 ENCOUNTER — Other Ambulatory Visit: Payer: Self-pay | Admitting: Family Medicine

## 2020-03-04 ENCOUNTER — Other Ambulatory Visit: Payer: Self-pay | Admitting: Family Medicine

## 2020-03-25 ENCOUNTER — Other Ambulatory Visit: Payer: Self-pay | Admitting: Family Medicine

## 2020-04-18 ENCOUNTER — Other Ambulatory Visit: Payer: Self-pay | Admitting: Family Medicine

## 2020-11-05 ENCOUNTER — Encounter: Payer: BC Managed Care – PPO | Admitting: Family Medicine

## 2021-01-25 ENCOUNTER — Telehealth: Payer: Self-pay

## 2021-01-25 NOTE — Telephone Encounter (Signed)
Pt called stating that she has a TOC tomorrow but is coughing and congested. She stated that she is covid neg. Pt wants to know if she can still be seen for her TOC tomorrow. Please Advise.

## 2021-01-25 NOTE — Telephone Encounter (Signed)
I spoke with pt to address concerns below. Pt was told that she can still come to her appointment, if she indeed tested negative for Covid and to wear a mask upon arrival. She voiced understanding.

## 2021-01-26 ENCOUNTER — Ambulatory Visit: Payer: BC Managed Care – PPO | Admitting: Family

## 2021-01-26 ENCOUNTER — Other Ambulatory Visit: Payer: Self-pay

## 2021-01-26 ENCOUNTER — Encounter: Payer: Self-pay | Admitting: Family

## 2021-01-26 VITALS — BP 135/87 | HR 88 | Temp 97.8°F | Ht 63.5 in | Wt 265.4 lb

## 2021-01-26 DIAGNOSIS — Z23 Encounter for immunization: Secondary | ICD-10-CM

## 2021-01-26 DIAGNOSIS — Z0000 Encounter for general adult medical examination without abnormal findings: Secondary | ICD-10-CM

## 2021-01-26 DIAGNOSIS — R053 Chronic cough: Secondary | ICD-10-CM

## 2021-01-26 DIAGNOSIS — E559 Vitamin D deficiency, unspecified: Secondary | ICD-10-CM

## 2021-01-26 HISTORY — DX: Encounter for immunization: Z23

## 2021-01-26 HISTORY — DX: Encounter for general adult medical examination without abnormal findings: Z00.00

## 2021-01-26 HISTORY — DX: Chronic cough: R05.3

## 2021-01-26 LAB — COMPREHENSIVE METABOLIC PANEL
ALT: 37 U/L — ABNORMAL HIGH (ref 0–35)
AST: 24 U/L (ref 0–37)
Albumin: 4.2 g/dL (ref 3.5–5.2)
Alkaline Phosphatase: 65 U/L (ref 39–117)
BUN: 18 mg/dL (ref 6–23)
CO2: 26 mEq/L (ref 19–32)
Calcium: 9 mg/dL (ref 8.4–10.5)
Chloride: 108 mEq/L (ref 96–112)
Creatinine, Ser: 0.79 mg/dL (ref 0.40–1.20)
GFR: 83.12 mL/min (ref >60.00)
Glucose, Bld: 112 mg/dL — ABNORMAL HIGH (ref 70–99)
Potassium: 4.2 mEq/L (ref 3.5–5.1)
Sodium: 143 mEq/L (ref 135–145)
Total Bilirubin: 0.3 mg/dL (ref 0.2–1.2)
Total Protein: 7.1 g/dL (ref 6.0–8.3)

## 2021-01-26 LAB — LIPID PANEL
Cholesterol: 187 mg/dL (ref 0–200)
HDL: 54.5 mg/dL (ref >39.00)
LDL Cholesterol: 114 mg/dL — ABNORMAL HIGH (ref 0–99)
NonHDL: 132.76
Total CHOL/HDL Ratio: 3
Triglycerides: 95 mg/dL (ref 0.0–149.0)
VLDL: 19 mg/dL (ref 0.0–40.0)

## 2021-01-26 LAB — CBC WITH DIFFERENTIAL/PLATELET
Basophils Absolute: 0 10*3/uL (ref 0.0–0.1)
Basophils Relative: 0.7 % (ref 0.0–3.0)
Eosinophils Absolute: 0.1 10*3/uL (ref 0.0–0.7)
Eosinophils Relative: 1.9 % (ref 0.0–5.0)
HCT: 41.7 % (ref 36.0–46.0)
Hemoglobin: 13.6 g/dL (ref 12.0–15.0)
Lymphocytes Relative: 27.4 % (ref 12.0–46.0)
Lymphs Abs: 1.5 10*3/uL (ref 0.7–4.0)
MCHC: 32.7 g/dL (ref 30.0–36.0)
MCV: 92.2 fl (ref 78.0–100.0)
Monocytes Absolute: 0.3 10*3/uL (ref 0.1–1.0)
Monocytes Relative: 5.7 % (ref 3.0–12.0)
Neutro Abs: 3.5 10*3/uL (ref 1.4–7.7)
Neutrophils Relative %: 64.3 % (ref 43.0–77.0)
Platelets: 171 10*3/uL (ref 150.0–400.0)
RBC: 4.52 Mil/uL (ref 3.87–5.11)
RDW: 13.9 % (ref 11.5–15.5)
WBC: 5.4 10*3/uL (ref 4.0–10.5)

## 2021-01-26 LAB — TSH: TSH: 1.89 u[IU]/mL (ref 0.35–5.50)

## 2021-01-26 LAB — VITAMIN D 25 HYDROXY (VIT D DEFICIENCY, FRACTURES): VITD: 30.64 ng/mL (ref 30.00–100.00)

## 2021-01-26 NOTE — Assessment & Plan Note (Signed)
Hx breast cancer - mother. Sending order for annual mammogram. Colonoscopy UTD, PAP UTD. VSS, discussed weight loss strategies.

## 2021-01-26 NOTE — Patient Instructions (Signed)
It was very nice to see you today!  Call back if your cough is not resolving in another week. [facility], [facility] should call you to schedule.   PLEASE NOTE:  If you had any lab tests please let us know if you have not heard back within a few days. You may see your results on mychart before we have a chance to review them but we will give you a call once they are reviewed by [place]. If we ordered any referrals today, please let us know if you have not heard from their office within the next week.   Please try these tips to maintain a healthy lifestyle:  Eat most of your calories during the day when you are active. Eliminate processed foods including packaged sweets (pies, cakes, cookies), reduce intake of potatoes, white bread, white pasta, and white rice. Look for whole grain options, oat flour or almond flour.  Each meal should contain half fruits/vegetables, one quarter protein, and one quarter carbs (no bigger than a computer mouse).  Cut down on sweet beverages. This includes juice, soda, and sweet tea. Also watch fruit intake, though this is a healthier sweet option, it still contains natural sugar! Limit to 3 servings daily.  Drink at least 1 glass of water with each meal and aim for at least 8 glasses per day  Exercise at least 150 minutes every week.

## 2021-01-26 NOTE — Progress Notes (Signed)
Phone [phone]   Subjective:   Patient female presenting for annual physical.    See problem oriented charting- ROS- full  review of systems was completed and negative except for: persistent cough.  Chief Complaint  Patient presents with   Annual Exam      Vitamin D Deficiency   Cough    Started 2 weeks ago; Covid Test is negative, taken yesterday. She has taken OTC theraflu and cough medicine (not specified).    HPI: Cough: Patient complains of nasal congestion and nonproductive cough.  Symptoms began 2 weeks ago.  The cough is non-productive, without wheezing, dyspnea or hemoptysis and is aggravated by cold air and exercise Associated symptoms include: nasal drainage . Patient does not have new pets. Patient does not have a history of asthma. Patient does not have a history of environmental allergens. Patient has not recent travel. Patient does not have a history of smoking. Patient  has not previous Chest X-ray. Patient has not had a PPD done.   The following were reviewed and entered/updated in epic: Patient Active Problem List   Diagnosis Date Noted   Annual physical exam 01/26/2021   Morbid obesity (HCC) 01/26/2021   Need for immunization against influenza 01/26/2021   Persistent cough 01/26/2021   Vitamin D deficiency 12/26/2018   Prediabetes 12/25/2018   Family history of breast cancer in mother 12/25/2018   Hammer toes, bilateral 12/25/2018   Past Surgical History:  Procedure Laterality Date   prolapsed colon  2000    Family History  Problem Relation Age of Onset   Breast cancer Mother     Medications- reviewed and updated Current Outpatient Medications  Medication Sig Dispense Refill   cholecalciferol (VITAMIN D3) 25 MCG (1000 UNIT) tablet Take 1,000 Units by mouth daily.     No current facility-administered medications for this visit.    Allergies-reviewed and updated No Known Allergies  Social History   Social History Narrative   Not on file    Objective  Objective:  BP 135/87   Pulse 88   Temp 97.8 F (36.6 C) (Temporal)   Ht 5' 3.5" (1.613 m)   Wt 265 lb 6.4 oz (120.4 kg)   SpO2 97%   BMI 46.28 kg/m  Gen: NAD, resting comfortably HEENT: Mucous membranes are moist. Oropharynx normal Neck: no thyromegaly CV: RRR no murmurs rubs or gallops Lungs: CTAB no crackles, wheeze, rhonchi Abdomen: soft/nontender/nondistended/normal bowel sounds. No rebound or guarding.  Ext: no edema Skin: warm, dry Neuro: grossly normal, moves all extremities, PERRLA   Assessment and Plan   Health Maintenance counseling: 1. Anticipatory guidance: Patient counseled regarding regular dental exams q6 months, eye exams,  avoiding smoking and second hand smoke , limiting alcohol to 1 beverage per day , no illicit drugs .   2. Risk factor reduction:  Advised patient of need for regular exercise and diet rich and fruits and vegetables to reduce risk of heart attack and stroke. Exercise- none. Diet-reviewed recommended healthy diet.  Wt Readings from Last 3 Encounters:  01/26/21 265 lb 6.4 oz (120.4 kg)  11/05/19 261 lb 9.6 oz (118.7 kg)  12/25/18 250 lb 12.8 oz (113.8 kg)   3. Immunizations/screenings/ancillary studies Immunization History  Administered Date(s) Administered   Influenza,inj,Quad PF,6+ Mos 01/21/2015, 02/14/2016, 02/14/2017, 02/19/2018, 12/25/2018, 01/26/2021   PFIZER(Purple Top)SARS-COV-2 Vaccination 06/14/2019, 07/02/2019, 02/13/2020   Tdap 01/21/2015   Unspecified SARS-COV-2 Vaccination 02/13/2020   Zoster Recombinat (Shingrix) 02/19/2018, 05/31/2018   Health Maintenance Due  Topic Date Due  COVID-19 Vaccine (4 - Booster for Pfizer series) 04/09/2020   4. Cervical cancer screening- 2021 5. Breast cancer screening-  mammogram 2021 6. Colon cancer screening - 2017 7. Skin cancer screening- advised regular sunscreen use. Denies worrisome, changing, or new skin lesions.  8. Birth control/STD check- N/A 9. Osteoporosis  screening- 2021 10. Smoking associated screening - non- smoker  Status of chronic or acute concerns     Recommended follow up: Return for Complete physical w/fasting labs. Future Appointments  Date Time Provider Department Center  01/27/2022  8:00 AM [name], NP [facility]    Lab/Order associations: fasting   ICD-10-CM   1. Vitamin D deficiency  E55.9 Vitamin D (25 hydroxy)    2. Annual physical exam  Z00.00 Comprehensive metabolic panel    TSH    Lipid panel    CBC with Differential/Platelet    3. Morbid obesity (HCC)  E66.01     4. Need for immunization against influenza  Z23 Flu Vaccine QUAD 64mo+IM (Fluarix, Fluzone & Alfiuria Quad PF)    5. Persistent cough  R05.3       No orders of the defined types were placed in this encounter.   Return precautions advised.  [name], NP

## 2021-01-26 NOTE — Assessment & Plan Note (Signed)
Wt. Loss strategies reviewed including portion control, less carbs including sweets, eating most of calories earlier in day, drinking 64oz water qd, and establishing daily exercise routine. °

## 2021-01-26 NOTE — Assessment & Plan Note (Signed)
Viral syndrome, started 2 weeks ago, cough has persisted, feels fine otherwise, denies hx Asthma or current reflux. Advised on continued OTC meds, drink at least 2liters water qd.

## 2021-02-08 ENCOUNTER — Telehealth: Payer: Self-pay

## 2021-02-08 NOTE — Telephone Encounter (Signed)
Lab results given to pt. She voiced understanding.

## 2021-02-08 NOTE — Telephone Encounter (Signed)
Pt called back regarding lab results. Please Advise.

## 2021-03-30 IMAGING — MG DIGITAL SCREENING BILAT W/ CAD
4 series · 4 of 4 positions shown · non-contrast
Comparison: Previous exam(s).

CLINICAL DATA: Screening.

EXAM:
DIGITAL SCREENING BILATERAL MAMMOGRAM WITH CAD

[L CC]
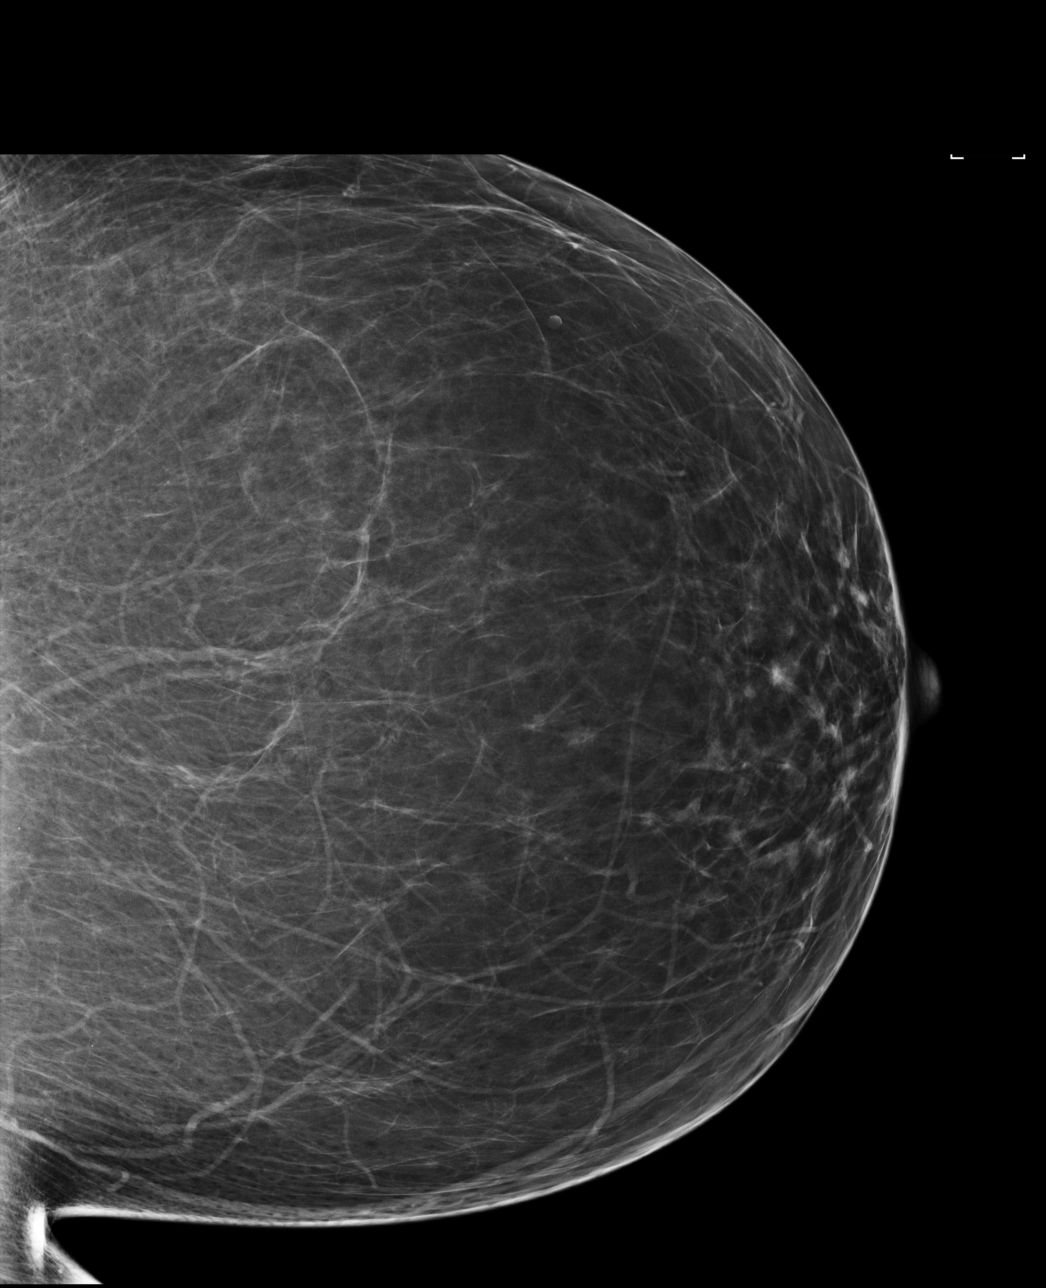

[R MLO]
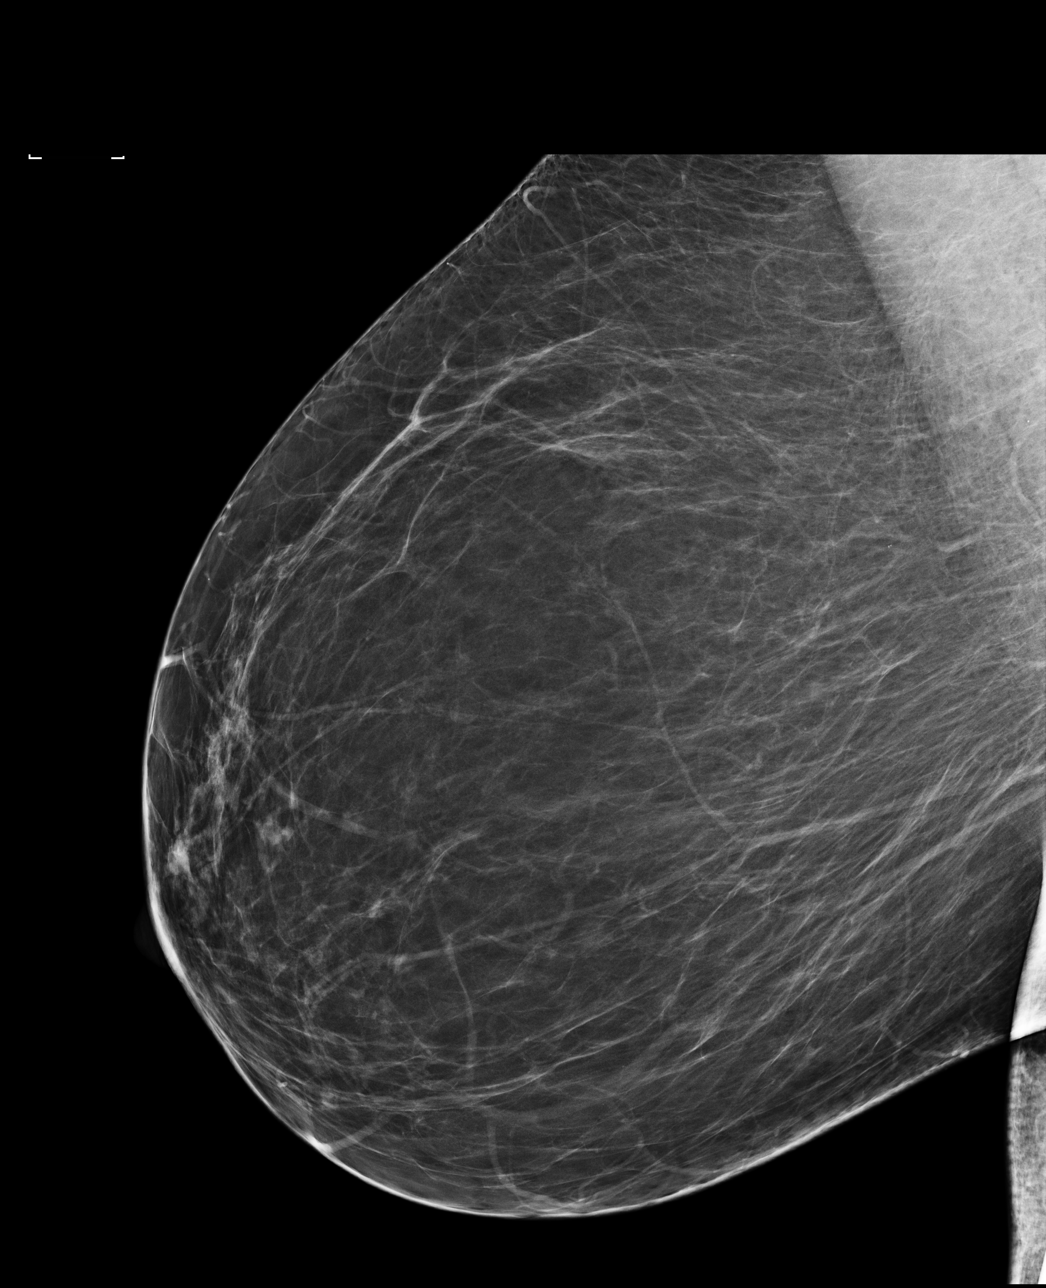

[R CC]
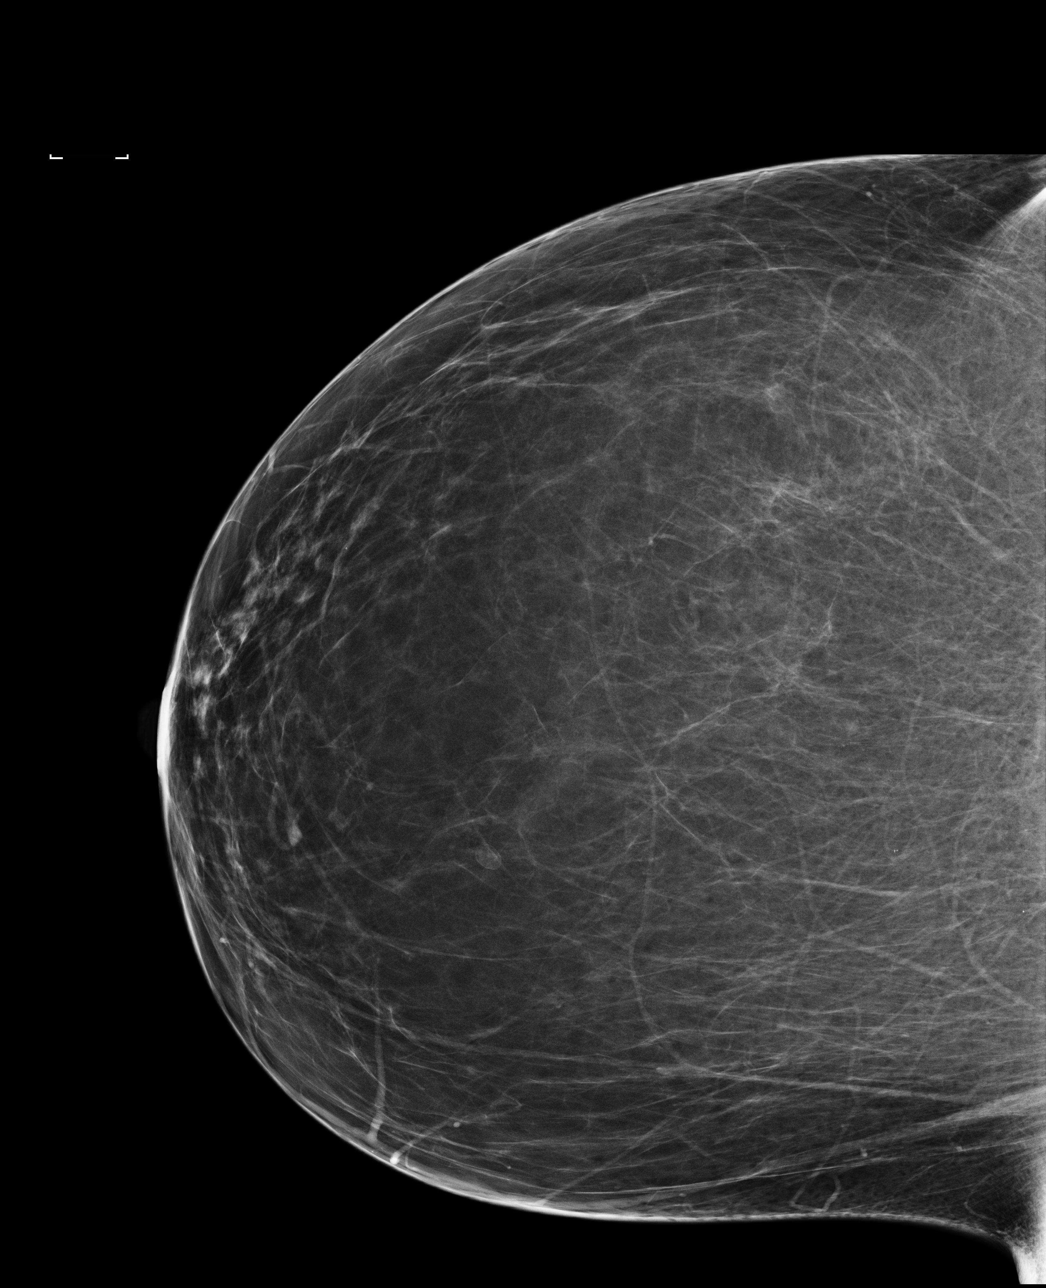

[L MLO]
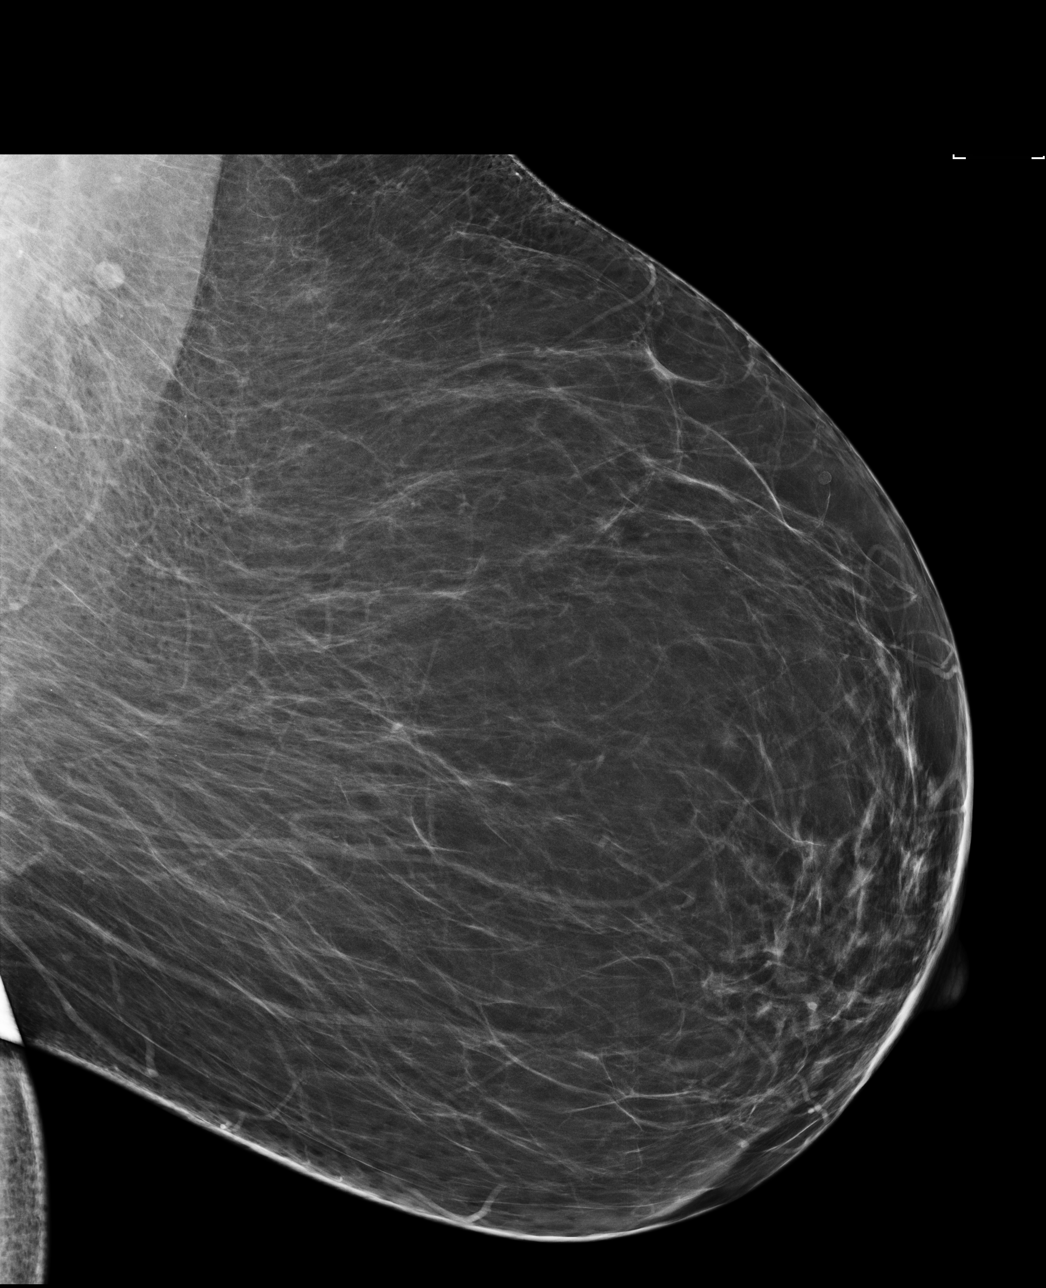

[4 of 4 positions shown; findings below may reference images not displayed]

ACR Breast Density Category b: There are scattered areas of
fibroglandular density.
FINDINGS: There are no findings suspicious for malignancy. Images were
processed with CAD.
IMPRESSION: No mammographic evidence of malignancy. A result letter of this
screening mammogram will be mailed directly to the patient.

RECOMMENDATION:
Screening mammogram in one year. (Code:AS-G-LCT)

BI-RADS CATEGORY  1: Negative.

## 2021-06-23 ENCOUNTER — Encounter: Payer: BC Managed Care – PPO | Admitting: Internal Medicine

## 2021-09-07 ENCOUNTER — Other Ambulatory Visit: Payer: Self-pay | Admitting: Family

## 2021-09-07 DIAGNOSIS — Z1231 Encounter for screening mammogram for malignant neoplasm of breast: Secondary | ICD-10-CM

## 2021-09-13 ENCOUNTER — Ambulatory Visit
Admission: RE | Admit: 2021-09-13 | Discharge: 2021-09-13 | Disposition: A | Discharge status: Home / Self Care | Payer: BC Managed Care – PPO | Source: Ambulatory Visit

## 2021-09-13 DIAGNOSIS — Z1231 Encounter for screening mammogram for malignant neoplasm of breast: Secondary | ICD-10-CM

## 2021-09-14 NOTE — Progress Notes (Signed)
Mammogram normal! Recheck in 1 year.

## 2021-12-12 ENCOUNTER — Encounter: Payer: Self-pay | Admitting: *Deleted

## 2022-01-27 ENCOUNTER — Ambulatory Visit (INDEPENDENT_AMBULATORY_CARE_PROVIDER_SITE_OTHER): Payer: BC Managed Care – PPO | Admitting: Family

## 2022-01-27 ENCOUNTER — Encounter: Payer: Self-pay | Admitting: Family

## 2022-01-27 VITALS — BP 117/80 | HR 90 | Temp 97.5°F | Ht 63.5 in | Wt 263.8 lb

## 2022-01-27 DIAGNOSIS — Z0000 Encounter for general adult medical examination without abnormal findings: Secondary | ICD-10-CM

## 2022-01-27 DIAGNOSIS — R7989 Other specified abnormal findings of blood chemistry: Secondary | ICD-10-CM | POA: Diagnosis not present

## 2022-01-27 DIAGNOSIS — Z23 Encounter for immunization: Secondary | ICD-10-CM

## 2022-01-27 LAB — COMPREHENSIVE METABOLIC PANEL
ALT: 21 U/L (ref 0–35)
AST: 19 U/L (ref 0–37)
Albumin: 4.2 g/dL (ref 3.5–5.2)
Alkaline Phosphatase: 52 U/L (ref 39–117)
BUN: 13 mg/dL (ref 6–23)
CO2: 29 mEq/L (ref 19–32)
Calcium: 9.2 mg/dL (ref 8.4–10.5)
Chloride: 108 mEq/L (ref 96–112)
Creatinine, Ser: 0.83 mg/dL (ref 0.40–1.20)
GFR: 77.79 mL/min (ref >60.00)
Glucose, Bld: 104 mg/dL — ABNORMAL HIGH (ref 70–99)
Potassium: 4.6 mEq/L (ref 3.5–5.1)
Sodium: 142 mEq/L (ref 135–145)
Total Bilirubin: 0.5 mg/dL (ref 0.2–1.2)
Total Protein: 6.8 g/dL (ref 6.0–8.3)

## 2022-01-27 LAB — LIPID PANEL
Cholesterol: 182 mg/dL (ref 0–200)
HDL: 60.6 mg/dL (ref >39.00)
LDL Cholesterol: 110 mg/dL — ABNORMAL HIGH (ref 0–99)
NonHDL: 121.85
Total CHOL/HDL Ratio: 3
Triglycerides: 58 mg/dL (ref 0.0–149.0)
VLDL: 11.6 mg/dL (ref 0.0–40.0)

## 2022-01-27 NOTE — Progress Notes (Signed)
Phone [phone]  Subjective:   Susan Eaton is a 56 y.o. female presenting for annual physical.    Chief Complaint  Susan Eaton presents with   Annual Exam    Pt has no questions or concerns, pt does want flu shot.     See problem oriented charting- ROS- full  review of systems was completed and negative.   The following were reviewed and entered/updated in epic: Past Medical History:  Diagnosis Date   Annual physical exam 01/26/2021   Need for immunization against influenza 01/26/2021   Persistent cough 01/26/2021   Susan Eaton Active Problem List   Diagnosis Date Noted   Annual physical exam 01/26/2021   Morbid obesity ([place]) 01/26/2021   Vitamin D deficiency 12/26/2018   Prediabetes 12/25/2018   Family history of breast cancer in mother 12/25/2018   Hammer toes, bilateral 12/25/2018   Past Surgical History:  Procedure Laterality Date   prolapsed colon  2000    Family History  Problem Relation Age of Onset   Breast cancer Mother     Medications- reviewed and updated Current Outpatient Medications  Medication Sig Dispense Refill   cholecalciferol (VITAMIN D3) 25 MCG (1000 UNIT) tablet Take 1,000 Units by mouth daily.     No current facility-administered medications for this visit.    Allergies-reviewed and updated No Known Allergies  Social History   Social History Narrative   Not on file    Objective:  BP 117/80 (BP Location: Left Arm, Susan Eaton Position: Sitting)   Pulse 90   Temp (!) 97.5 F (36.4 C) (Temporal)   Ht 5' 3.5" (1.613 m)   Wt 263 lb 12.8 oz (119.7 kg)   SpO2 96%   BMI 46.00 kg/m  Physical Exam Vitals and nursing note reviewed.  Constitutional:      Appearance: Normal appearance. She is obese.  HENT:     Head: Normocephalic.     Right Ear: Tympanic membrane normal.     Left Ear: Tympanic membrane normal.     Nose: Nose normal.     Mouth/Throat:     Mouth: Mucous membranes are moist.  Eyes:     Pupils: Pupils are equal, round, and  reactive to light.  Cardiovascular:     Rate and Rhythm: Normal rate and regular rhythm.  Pulmonary:     Effort: Pulmonary effort is normal.     Breath sounds: Normal breath sounds.  Musculoskeletal:        General: Normal range of motion.     Cervical back: Normal range of motion.  Lymphadenopathy:     Cervical: No cervical adenopathy.  Skin:    General: Skin is warm and dry.  Neurological:     Mental Status: She is alert.  Psychiatric:        Mood and Affect: Mood normal.        Behavior: Behavior normal.       Assessment and Plan   Health Maintenance counseling: 1. Anticipatory guidance: Susan Eaton counseled regarding regular dental exams q6 months, eye exams,  avoiding smoking and second hand smoke, limiting alcohol to 1 beverage per day, no illicit drugs.   2. Risk factor reduction:  Advised Susan Eaton of need for regular exercise and diet rich with fruits and vegetables to reduce risk of heart attack and stroke. Exercise- none.  Wt Readings from Last 3 Encounters:  01/27/22 263 lb 12.8 oz (119.7 kg)  01/26/21 265 lb 6.4 oz (120.4 kg)  11/05/19 261 lb 9.6 oz (118.7 kg)   3.  Immunizations/screenings/ancillary studies Immunization History  Administered Date(s) Administered   Influenza,inj,Quad PF,6+ Mos 01/21/2015, 02/14/2016, 02/14/2017, 02/19/2018, 12/25/2018, 01/26/2021, 01/27/2022   PFIZER(Purple Top)SARS-COV-2 Vaccination 06/14/2019, 07/02/2019, 02/13/2020   Tdap 01/21/2015   Unspecified SARS-COV-2 Vaccination 02/13/2020   Zoster Recombinat (Shingrix) 02/19/2018, 05/31/2018   There are no preventive care reminders to display for this Susan Eaton.   4. Cervical cancer screening-  10/2019 - normal 5. Breast cancer screening-  mammogram: 08/2021 6. Colon cancer screening - 2016 - normal 7. Skin cancer screening- advised regular sunscreen use. Denies worrisome, changing, or new skin lesions.  8. Birth control/STD check- N/A 9. Osteoporosis screening- 2021 - normal 10. Alcohol  screening: rare 11. Smoking associated screening (lung cancer screening, AAA screen 65-75, UA)- non- smoker  Problem List Items Addressed This Visit       Other   Annual physical exam - Primary labs wnl last year, no new symptoms or concerns today, rechecking CMP for kidney fx, glucose d/t morbid obesity. all preventative care utd. receiving flu shot today.    Relevant Orders   Comp Met (CMET)   Morbid obesity ([place])    Chronic wt stable from last year continue to advise on wt loss strategies, effects of heavy weight on joints, heart, etc. pt has gym membership, but has not gone! offered encouragement, discussed intermittent fasting briefly f/u prn      Relevant Orders   Comp Met (CMET)   Other Visit Diagnoses     High serum low-density lipoprotein (LDL)       Relevant Orders   Lipid panel   Need for immunization against influenza       Relevant Orders   Flu Vaccine QUAD 67moIM (Fluarix, Fluzone & Alfiuria Quad PF) (Completed)     Recommended follow up: No follow-ups on file. Future Appointments  Date Time Provider [place] 01/29/2023  8:00 AM HJeanie Sewer NP [facility]     Lab/Order associations:fasting   HJeanie Sewer NP

## 2022-01-27 NOTE — Assessment & Plan Note (Signed)
Chronic wt stable from last year continue to advise on wt loss strategies, effects of heavy weight on joints, heart, etc. pt has gym membership, but has not gone! offered encouragement, discussed intermittent fasting briefly f/u prn

## 2022-02-01 NOTE — Progress Notes (Signed)
Your glucose (sugar) is just slightly elevated, electrolytes, kidney and liver function, and cholesterol numbers are all good.  Just your bad # (LDL) is slightly elevated, but down from last time, which is great! Keep working on reducing the saturated fat in your diet. Keep up the good work with overall controlling your diet and continue to try and shoot for 30 minutes of exercise daily!

## 2023-01-20 ENCOUNTER — Other Ambulatory Visit: Payer: Self-pay | Admitting: Family

## 2023-01-20 DIAGNOSIS — Z1231 Encounter for screening mammogram for malignant neoplasm of breast: Secondary | ICD-10-CM

## 2023-01-29 ENCOUNTER — Encounter: Payer: BC Managed Care – PPO | Admitting: Family

## 2023-01-31 ENCOUNTER — Encounter: Payer: Self-pay | Admitting: Family

## 2023-01-31 ENCOUNTER — Ambulatory Visit (INDEPENDENT_AMBULATORY_CARE_PROVIDER_SITE_OTHER): Payer: BC Managed Care – PPO | Admitting: Family

## 2023-01-31 VITALS — BP 131/84 | HR 72 | Temp 98.0°F | Ht 63.5 in | Wt 257.4 lb

## 2023-01-31 DIAGNOSIS — Z0000 Encounter for general adult medical examination without abnormal findings: Secondary | ICD-10-CM

## 2023-01-31 DIAGNOSIS — Z23 Encounter for immunization: Secondary | ICD-10-CM

## 2023-01-31 DIAGNOSIS — Z8639 Personal history of other endocrine, nutritional and metabolic disease: Secondary | ICD-10-CM | POA: Diagnosis not present

## 2023-01-31 DIAGNOSIS — R7301 Impaired fasting glucose: Secondary | ICD-10-CM

## 2023-01-31 DIAGNOSIS — M2041 Other hammer toe(s) (acquired), right foot: Secondary | ICD-10-CM

## 2023-01-31 DIAGNOSIS — Z1283 Encounter for screening for malignant neoplasm of skin: Secondary | ICD-10-CM

## 2023-01-31 DIAGNOSIS — M2042 Other hammer toe(s) (acquired), left foot: Secondary | ICD-10-CM

## 2023-01-31 DIAGNOSIS — R7989 Other specified abnormal findings of blood chemistry: Secondary | ICD-10-CM | POA: Diagnosis not present

## 2023-01-31 LAB — VITAMIN D 25 HYDROXY (VIT D DEFICIENCY, FRACTURES): VITD: 34.02 ng/mL (ref 30.00–100.00)

## 2023-01-31 LAB — LIPID PANEL
Cholesterol: 192 mg/dL (ref 0–200)
HDL: 54.3 mg/dL (ref >39.00)
LDL Cholesterol: 114 mg/dL — ABNORMAL HIGH (ref 0–99)
NonHDL: 137.78
Total CHOL/HDL Ratio: 4
Triglycerides: 120 mg/dL (ref 0.0–149.0)
VLDL: 24 mg/dL (ref 0.0–40.0)

## 2023-01-31 LAB — CBC WITH DIFFERENTIAL/PLATELET
Basophils Absolute: 0.1 10*3/uL (ref 0.0–0.1)
Basophils Relative: 1.3 % (ref 0.0–3.0)
Eosinophils Absolute: 0.1 10*3/uL (ref 0.0–0.7)
Eosinophils Relative: 1.2 % (ref 0.0–5.0)
HCT: 43.4 % (ref 36.0–46.0)
Hemoglobin: 14.5 g/dL (ref 12.0–15.0)
Lymphocytes Relative: 25.3 % (ref 12.0–46.0)
Lymphs Abs: 1.2 10*3/uL (ref 0.7–4.0)
MCHC: 33.5 g/dL (ref 30.0–36.0)
MCV: 92.9 fL (ref 78.0–100.0)
Monocytes Absolute: 0.3 10*3/uL (ref 0.1–1.0)
Monocytes Relative: 6.1 % (ref 3.0–12.0)
Neutro Abs: 3.1 10*3/uL (ref 1.4–7.7)
Neutrophils Relative %: 66.1 % (ref 43.0–77.0)
Platelets: 194 10*3/uL (ref 150.0–400.0)
RBC: 4.67 Mil/uL (ref 3.87–5.11)
RDW: 13.8 % (ref 11.5–15.5)
WBC: 4.6 10*3/uL (ref 4.0–10.5)

## 2023-01-31 LAB — COMPREHENSIVE METABOLIC PANEL
ALT: 20 U/L (ref 0–35)
AST: 15 U/L (ref 0–37)
Albumin: 4.2 g/dL (ref 3.5–5.2)
Alkaline Phosphatase: 60 U/L (ref 39–117)
BUN: 18 mg/dL (ref 6–23)
CO2: 28 meq/L (ref 19–32)
Calcium: 9.4 mg/dL (ref 8.4–10.5)
Chloride: 106 meq/L (ref 96–112)
Creatinine, Ser: 0.95 mg/dL (ref 0.40–1.20)
GFR: 65.68 mL/min (ref >60.00)
Glucose, Bld: 113 mg/dL — ABNORMAL HIGH (ref 70–99)
Potassium: 4.1 meq/L (ref 3.5–5.1)
Sodium: 142 meq/L (ref 135–145)
Total Bilirubin: 0.7 mg/dL (ref 0.2–1.2)
Total Protein: 7 g/dL (ref 6.0–8.3)

## 2023-01-31 NOTE — Patient Instructions (Addendum)
It was very nice to see you today!   I will review your lab results via MyChart in a few days. I have sent over your referrals to Podiatry and Dermatology.  Have a great week!     PLEASE NOTE:  If you had any lab tests please let us know if you have not heard back within a few days. You may see your results on MyChart before we have a chance to review them but we will give you a call once they are reviewed by [place]. If we ordered any referrals today, please let us know if you have not heard from their office within the next week.

## 2023-01-31 NOTE — Progress Notes (Signed)
Phone [phone]  Subjective:   Patient is a 57 y.o. female presenting for annual physical.    Chief Complaint  Patient presents with   Annual Exam    Fasting w/ labs    Discussed the use of AI scribe software for clinical note transcription with the patient, who gave verbal consent to proceed.  History of Present Illness   Susan Eaton is presenting today for a physical. She is a patient with a history of low vitamin D levels, and high LDL, and a hammer toe on her right 2nd toe and less severe on her left 2nd toe. She was told in past only correction is via surgery requiring 6 weeks recovery time & she is wanting to discuss options.  She has been trying to maintain hydration and has been taking over-the-counter vitamin D supplements. She also mentions a possible fungal infection in her toes and discomfort due to her hammer toe, which has been affecting her choice of footwear. She has been using hearing aids for about a year.     See problem oriented charting- ROS- full  review of systems was completed and negative.  The following were reviewed and entered/updated in epic: Past Medical History:  Diagnosis Date   Annual physical exam 01/26/2021   Need for immunization against influenza 01/26/2021   Persistent cough 01/26/2021   Patient Active Problem List   Diagnosis Date Noted   High serum low-density lipoprotein (LDL) 01/31/2023   Annual physical exam 01/26/2021   Morbid obesity (HCC) 01/26/2021   Vitamin D deficiency 12/26/2018   Prediabetes 12/25/2018   Family history of breast cancer in mother 12/25/2018   Hammer toes, bilateral 12/25/2018   Past Surgical History:  Procedure Laterality Date   prolapsed colon  2000    Family History  Problem Relation Age of Onset   Breast cancer Mother     Medications- reviewed and updated Current Outpatient Medications  Medication Sig Dispense Refill   cholecalciferol (VITAMIN D3) 25 MCG (1000 UNIT) tablet Take 1,000 Units by mouth  daily.     No current facility-administered medications for this visit.    Allergies-reviewed and updated No Known Allergies  Social History   Social History Narrative   Not on file    Objective:  BP 131/84 (BP Location: Left Arm, Patient Position: Sitting, Cuff Size: Large)   Pulse 72   Temp 98 F (36.7 C) (Temporal)   Ht 5' 3.5" (1.613 m)   Wt 257 lb 6 oz (116.7 kg)   SpO2 97%   BMI 44.88 kg/m  Physical Exam Vitals and nursing note reviewed.  Constitutional:      Appearance: Normal appearance. She is obese.  HENT:     Head: Normocephalic.     Right Ear: Tympanic membrane normal.     Left Ear: Tympanic membrane normal.     Nose: Nose normal.     Mouth/Throat:     Mouth: Mucous membranes are moist.  Eyes:     Pupils: Pupils are equal, round, and reactive to light.  Cardiovascular:     Rate and Rhythm: Normal rate and regular rhythm.  Pulmonary:     Effort: Pulmonary effort is normal.     Breath sounds: Normal breath sounds.  Musculoskeletal:        General: Normal range of motion.     Cervical back: Normal range of motion.  Lymphadenopathy:     Cervical: No cervical adenopathy.  Skin:    General: Skin is warm and dry.  Neurological:     Mental Status: She is alert.  Psychiatric:        Mood and Affect: Mood normal.        Behavior: Behavior normal.      Assessment and Plan   Health Maintenance counseling: 1. Anticipatory guidance: Patient counseled regarding regular dental exams q6 months, eye exams,  avoiding smoking and second hand smoke, limiting alcohol to 1 beverage per day, no illicit drugs.   2. Risk factor reduction:  Advised patient of need for regular exercise and diet rich with fruits and vegetables to reduce risk of heart attack and stroke. Exercise- none.  Wt Readings from Last 3 Encounters:  01/31/23 257 lb 6 oz (116.7 kg)  01/27/22 263 lb 12.8 oz (119.7 kg)  01/26/21 265 lb 6.4 oz (120.4 kg)   3. Immunizations/screenings/ancillary  studies Immunization History  Administered Date(s) Administered   Influenza,inj,Quad PF,6+ Mos 01/21/2015, 02/14/2016, 02/14/2017, 02/19/2018, 12/25/2018, 01/26/2021, 01/27/2022   PFIZER(Purple Top)SARS-COV-2 Vaccination 06/14/2019, 07/02/2019, 02/13/2020   Tdap 01/21/2015   Unspecified SARS-COV-2 Vaccination 02/13/2020   Zoster Recombinant(Shingrix) 02/19/2018, 05/31/2018   Health Maintenance Due  Topic Date Due   INFLUENZA VACCINE  10/19/2022     4. Cervical cancer screening-  10/2019 - normal 5. Breast cancer screening-  mammogram: 08/2021, scheduled for 02/2023 6. Colon cancer screening - 2016 - normal 7. Skin cancer screening- advised regular sunscreen use. Denies worrisome, changing, or new skin lesions.  8. Birth control/STD check- N/A 9. Osteoporosis screening- 2021 - normal 10. Alcohol screening: rare 11. Smoking associated screening (lung cancer screening, AAA screen 65-75, UA)- non- smoker  Assessment and Plan    Hammer toes - Reports discomfort due to hammer toe and possible fungal infection. -Refer to podiatry for evaluation and management.  Vitamin D Deficiency - Currently taking over-the-counter Vitamin D, she thinks 1,000u qd. -Check Vitamin D levels today.  Annual Physical - -Administer influenza vaccine today. -Refer to dermatology for skin cancer screening. -Check cholesterol panel today, CMP, & CBC. -Review colonoscopy records from [facility] and determine if repeat colonoscopy is due prior to 2026.Marland Kitchen     Recommended follow up: Return for any future concerns, Complete physical w/fasting labs. Future Appointments  Date Time Provider Department Center  02/19/2023  7:00 AM [facility]   Lab/Order associations: fasting  [name], NP

## 2023-02-01 ENCOUNTER — Encounter: Payer: Self-pay | Admitting: Family

## 2023-02-01 ENCOUNTER — Ambulatory Visit: Payer: BC Managed Care – PPO

## 2023-02-01 DIAGNOSIS — R7301 Impaired fasting glucose: Secondary | ICD-10-CM | POA: Diagnosis not present

## 2023-02-01 LAB — HEMOGLOBIN A1C: Hgb A1c MFr Bld: 5.9 % (ref 4.6–6.5)

## 2023-02-01 NOTE — Addendum Note (Signed)
Addended byDulce Sellar on: 02/01/2023 10:44 AM   Modules accepted: Orders

## 2023-02-05 NOTE — Telephone Encounter (Signed)
please send referral to [facility]. thx

## 2023-02-19 ENCOUNTER — Ambulatory Visit: Payer: BC Managed Care – PPO | Admitting: Podiatry

## 2023-02-19 ENCOUNTER — Ambulatory Visit
Admission: RE | Admit: 2023-02-19 | Discharge: 2023-02-19 | Disposition: A | Discharge status: Home / Self Care | Payer: BC Managed Care – PPO | Source: Ambulatory Visit

## 2023-02-19 ENCOUNTER — Encounter: Payer: Self-pay | Admitting: Podiatry

## 2023-02-19 ENCOUNTER — Ambulatory Visit (INDEPENDENT_AMBULATORY_CARE_PROVIDER_SITE_OTHER): Payer: BC Managed Care – PPO

## 2023-02-19 DIAGNOSIS — M21962 Unspecified acquired deformity of left lower leg: Secondary | ICD-10-CM

## 2023-02-19 DIAGNOSIS — M778 Other enthesopathies, not elsewhere classified: Secondary | ICD-10-CM | POA: Diagnosis not present

## 2023-02-19 DIAGNOSIS — Z1231 Encounter for screening mammogram for malignant neoplasm of breast: Secondary | ICD-10-CM | POA: Diagnosis not present

## 2023-02-19 DIAGNOSIS — M2041 Other hammer toe(s) (acquired), right foot: Secondary | ICD-10-CM

## 2023-02-19 DIAGNOSIS — M2042 Other hammer toe(s) (acquired), left foot: Secondary | ICD-10-CM

## 2023-02-19 DIAGNOSIS — M21961 Unspecified acquired deformity of right lower leg: Secondary | ICD-10-CM

## 2023-02-19 DIAGNOSIS — M7661 Achilles tendinitis, right leg: Secondary | ICD-10-CM

## 2023-02-19 NOTE — Patient Instructions (Signed)

## 2023-02-19 NOTE — Progress Notes (Unsigned)
Subjective:   Patient ID: Susan Eaton, female   DOB: 57 y.o.   MRN: [id number]   HPI Chief Complaint  Patient presents with   Hammer Toe    RM#13 Lewie Chamber Toes getting worse patient states unable wear shoes at this time.    56 year old female presents the above concerns.  She been having toe deformity, discomfort for quite some time.  She wants to see where she is at discuss options.  This is mostly her right foot but is also starting her left foot as well.  She mainly pain also along her heel on the bottom and also her Achilles tendon at times.  She is not reporting recent injuries.  She does not report any recent injuries.  Review of Systems  All other systems reviewed and are negative.  Past Medical History:  Diagnosis Date   Annual physical exam 01/26/2021   Need for immunization against influenza 01/26/2021   Persistent cough 01/26/2021    Past Surgical History:  Procedure Laterality Date   prolapsed colon  2000     Current Outpatient Medications:    cholecalciferol (VITAMIN D3) 25 MCG (1000 UNIT) tablet, Take 1,000 Units by mouth daily., Disp: , Rfl:   No Known Allergies         Objective:  Physical Exam  General: AAO x3, NAD  Dermatological: Skin is warm, dry and supple bilateral. There are no open sores, no preulcerative lesions, no rash or signs of infection present.  Vascular: Dorsalis Pedis artery and Posterior Tibial artery pedal pulses are 2/4 bilateral with immedate capillary fill time. There is no pain with calf compression, swelling, warmth, erythema.   Neruologic: Grossly intact via light touch bilateral.   Musculoskeletal: Visual contracture mostly in the right foot much worse than the left and there is overlapping second toe on the hallux and there is medial deviation of the second, third digits.  There is no area of point tenderness.  She does get some pain along the insertion of plantar fascia, along the Achilles tendon but there is no deficit  noted and tendons will be intact.  MMT 5/5.  No edema.  Gait: Unassisted, Nonantalgic.       Assessment:   57 year old female with distal from the right foot worse than left; tendonitis      Plan:  -Treatment options discussed including all alternatives, risks, and complications -Etiology of symptoms were discussed -X-rays were obtained and reviewed with the patient.  3 views bilateral feet were obtained.  No evidence of acute fracture noted.  On the right foot there is digital contracture mostly of the second, third digits.  On the left foot there is digital contracture of the mostly the third digit. -Regards to her toe deformity, pain we discussed the conservative as well as surgical options.  I do think that at some point surgery on the right foot will be needed we discussed hammertoe repair of the second, third toes with metatarsal osteotomies of the second third metatarsals.  She is to consider options.  May also continue with conservative treatment, she modifications avoid pressure.  On the left foot we discussed offloading pads, supportive shoes and different exercises to help keep the flexibility of the digits. -Stretching exercises provided to help with tendonitis. Supportive shoegear.   Vivi Barrack DPM

## 2023-02-21 ENCOUNTER — Other Ambulatory Visit: Payer: Self-pay | Admitting: Family

## 2023-02-21 DIAGNOSIS — R928 Other abnormal and inconclusive findings on diagnostic imaging of breast: Secondary | ICD-10-CM

## 2023-03-12 ENCOUNTER — Ambulatory Visit
Admission: RE | Admit: 2023-03-12 | Discharge: 2023-03-12 | Disposition: A | Discharge status: Home / Self Care | Payer: BC Managed Care – PPO | Source: Ambulatory Visit | Attending: Family | Admitting: Family

## 2023-03-12 ENCOUNTER — Ambulatory Visit: Payer: BC Managed Care – PPO

## 2023-03-12 DIAGNOSIS — R928 Other abnormal and inconclusive findings on diagnostic imaging of breast: Secondary | ICD-10-CM

## 2023-03-15 ENCOUNTER — Encounter (INDEPENDENT_AMBULATORY_CARE_PROVIDER_SITE_OTHER): Payer: BC Managed Care – PPO | Admitting: Physician Assistant

## 2023-03-17 ENCOUNTER — Telehealth: Payer: BC Managed Care – PPO | Admitting: Family Medicine

## 2023-03-17 DIAGNOSIS — J019 Acute sinusitis, unspecified: Secondary | ICD-10-CM

## 2023-03-17 DIAGNOSIS — B9689 Other specified bacterial agents as the cause of diseases classified elsewhere: Secondary | ICD-10-CM | POA: Diagnosis not present

## 2023-03-17 MED ORDER — AMOXICILLIN-POT CLAVULANATE 875-125 MG PO TABS: 1.0000 | ORAL_TABLET | Freq: Two times a day (BID) | ORAL | 0 refills | Status: DC

## 2023-03-17 NOTE — Progress Notes (Signed)
Virtual Visit Consent   MECIA KRAFT, you are scheduled for a virtual visit with a [facility] provider today. Just as with appointments in the office, your consent must be obtained to participate. Your consent will be active for this visit and any virtual visit you may have with one of our providers in the next 365 days. If you have a MyChart account, a copy of this consent can be sent to you electronically.  As this is a virtual visit, video technology does not allow for your provider to perform a traditional examination. This may limit your provider's ability to fully assess your condition. If your provider identifies any concerns that need to be evaluated in person or the need to arrange testing (such as labs, EKG, etc.), we will make arrangements to do so. Although advances in technology are sophisticated, we cannot ensure that it will always work on either your end or our end. If the connection with a video visit is poor, the visit may have to be switched to a telephone visit. With either a video or telephone visit, we are not always able to ensure that we have a secure connection.  By engaging in this virtual visit, you consent to the provision of healthcare and authorize for your insurance to be billed (if applicable) for the services provided during this visit. Depending on your insurance coverage, you may receive a charge related to this service.  I need to obtain your verbal consent now. Are you willing to proceed with your visit today? Susan Eaton has provided verbal consent on 03/17/2023 for a virtual visit (video or telephone). Georgana Curio, FNP  Date: 03/17/2023 2:21 PM  Virtual Visit via Video Note   I, Georgana Curio, connected with  Susan Eaton  ([id number], Aug 31, 1965) on 03/17/23 at  2:30 PM EST by a video-enabled telemedicine application and verified that I am speaking with the correct person using two identifiers.  Location: Patient: Virtual Visit Location Patient:  Home Provider: Virtual Visit Location Provider: Home Office   I discussed the limitations of evaluation and management by telemedicine and the availability of in person appointments. The patient expressed understanding and agreed to proceed.    History of Present Illness: Susan Eaton is a 57 y.o. who identifies as a female who was assigned female at birth, and is being seen today for cough and head congestion for 3-4 weeks worsening with increased sinus pain and pressure, swelling over sinuses this am and over eyes. No fever. No vision changes. Tender over maxillary sinuses. Marland Kitchen  HPI: HPI  Problems:  Patient Active Problem List   Diagnosis Date Noted   High serum low-density lipoprotein (LDL) 01/31/2023   Annual physical exam 01/26/2021   Morbid obesity (HCC) 01/26/2021   Vitamin D deficiency 12/26/2018   Prediabetes 12/25/2018   Family history of breast cancer in mother 12/25/2018   Hammer toes, bilateral 12/25/2018    Allergies: No Known Allergies Medications:  Current Outpatient Medications:    amoxicillin-clavulanate (AUGMENTIN) 875-125 MG tablet, Take 1 tablet by mouth 2 (two) times daily., Disp: 20 tablet, Rfl: 0   cholecalciferol (VITAMIN D3) 25 MCG (1000 UNIT) tablet, Take 1,000 Units by mouth daily., Disp: , Rfl:   Observations/Objective: Patient is well-developed, well-nourished in no acute distress.  Resting comfortably  at home.  Head is normocephalic, atraumatic.  No labored breathing.  Speech is clear and coherent with logical content.  Patient is alert and oriented at baseline.  Puffiness noted over eyes  and max sinuses bilat.   Assessment and Plan: 1. Acute bacterial sinusitis (Primary)  Increase fluids, continue otc cold meds, cont ibuprofen and flonase, UC if sx worsen. Take antibiotic with food and yogurt or probiotics.   Follow Up Instructions: I discussed the assessment and treatment plan with the patient. The patient was provided an opportunity to  ask questions and all were answered. The patient agreed with the plan and demonstrated an understanding of the instructions.  A copy of instructions were sent to the patient via MyChart unless otherwise noted below.     The patient was advised to call back or seek an in-person evaluation if the symptoms worsen or if the condition fails to improve as anticipated.    Georgana Curio, FNP

## 2023-03-17 NOTE — Patient Instructions (Signed)

## 2023-04-03 ENCOUNTER — Telehealth: Payer: BC Managed Care – PPO | Admitting: Nurse Practitioner

## 2023-04-03 DIAGNOSIS — J0120 Acute ethmoidal sinusitis, unspecified: Secondary | ICD-10-CM

## 2023-04-03 MED ORDER — CETIRIZINE HCL 10 MG PO TABS: 10.0000 mg | ORAL_TABLET | Freq: Every day | ORAL | 2 refills | Status: DC

## 2023-04-03 MED ORDER — PREDNISONE 10 MG (21) PO TBPK: ORAL_TABLET | ORAL | 0 refills | Status: DC

## 2023-04-03 NOTE — Progress Notes (Signed)
 Virtual Visit Consent   Susan Eaton, you are scheduled for a virtual visit with a [facility] provider today. Just as with appointments in the office, your consent must be obtained to participate. Your consent will be active for this visit and any virtual visit you may have with one of our providers in the next 365 days. If you have a MyChart account, a copy of this consent can be sent to you electronically.  As this is a virtual visit, video technology does not allow for your provider to perform a traditional examination. This may limit your provider's ability to fully assess your condition. If your provider identifies any concerns that need to be evaluated in person or the need to arrange testing (such as labs, EKG, etc.), we will make arrangements to do so. Although advances in technology are sophisticated, we cannot ensure that it will always work on either your end or our end. If the connection with a video visit is poor, the visit may have to be switched to a telephone visit. With either a video or telephone visit, we are not always able to ensure that we have a secure connection.  By engaging in this virtual visit, you consent to the provision of healthcare and authorize for your insurance to be billed (if applicable) for the services provided during this visit. Depending on your insurance coverage, you may receive a charge related to this service.  I need to obtain your verbal consent now. Are you willing to proceed with your visit today? Susan Eaton has provided verbal consent on 04/03/2023 for a virtual visit (video or telephone). Lauraine Kitty, FNP  Date: 04/03/2023 7:50 AM  Virtual Visit via Video Note   I, Lauraine Kitty, connected with  Susan Eaton  ([id number], 11-09-1965) on 04/03/23 at  8:00 AM EST by a video-enabled telemedicine application and verified that I am speaking with the correct person using two identifiers.  Location: Patient: Virtual Visit Location Patient:  Home Provider: Virtual Visit Location Provider: Home Office   I discussed the limitations of evaluation and management by telemedicine and the availability of in person appointments. The patient expressed understanding and agreed to proceed.    History of Present Illness: Susan Eaton is a 58 y.o. who identifies as a female who was assigned female at birth, and is being seen today for recurrent sinus symptoms  She was seen 03/17/23 with sinusitis that was causing nasal swelling and puffy eyes She did have improvement while on the antibiotic  She has continued Flonase  She was also using Advil cold sinus   She has not had a fever or body aches   She does take benadryl daily     Problems:  Patient Active Problem List   Diagnosis Date Noted   High serum low-density lipoprotein (LDL) 01/31/2023   Annual physical exam 01/26/2021   Morbid obesity (HCC) 01/26/2021   Vitamin D  deficiency 12/26/2018   Prediabetes 12/25/2018   Family history of breast cancer in mother 12/25/2018   Hammer toes, bilateral 12/25/2018    Allergies: No Known Allergies Medications:   Observations/Objective: Patient is well-developed, well-nourished in no acute distress.  Resting comfortably  at home.  Head is normocephalic, atraumatic.  No labored breathing.  Speech is clear and coherent with logical content.  Patient is alert and oriented at baseline.    Assessment and Plan:  1. Acute non-recurrent ethmoidal sinusitis (Primary)  Discussed continuing Flonase daily   Meds ordered this encounter  Medications   predniSONE  (STERAPRED UNI-PAK 21 TAB) 10 MG (21) TBPK tablet    Sig: Take 6 tablets on day one, 5 on day two, 4 on day three, 3 on day four, 2 on day five, and 1 on day six. Take with food.    Dispense:  21 tablet    Refill:  0   cetirizine  (ZYRTEC  ALLERGY) 10 MG tablet    Sig: Take 1 tablet (10 mg total) by mouth at bedtime.    Dispense:  30 tablet    Refill:  2        Follow Up  Instructions: I discussed the assessment and treatment plan with the patient. The patient was provided an opportunity to ask questions and all were answered. The patient agreed with the plan and demonstrated an understanding of the instructions.  A copy of instructions were sent to the patient via MyChart unless otherwise noted below.    The patient was advised to call back or seek an in-person evaluation if the symptoms worsen or if the condition fails to improve as anticipated.    Lauraine Kitty, FNP

## 2023-04-18 ENCOUNTER — Encounter (INDEPENDENT_AMBULATORY_CARE_PROVIDER_SITE_OTHER): Payer: Self-pay | Admitting: Physician Assistant

## 2023-04-18 ENCOUNTER — Ambulatory Visit (INDEPENDENT_AMBULATORY_CARE_PROVIDER_SITE_OTHER): Payer: BC Managed Care – PPO | Admitting: Physician Assistant

## 2023-04-18 VITALS — BP 124/84 | HR 84 | Temp 97.9°F | Ht 64.0 in | Wt 256.0 lb

## 2023-04-18 DIAGNOSIS — R7989 Other specified abnormal findings of blood chemistry: Secondary | ICD-10-CM | POA: Diagnosis not present

## 2023-04-18 DIAGNOSIS — E559 Vitamin D deficiency, unspecified: Secondary | ICD-10-CM

## 2023-04-18 DIAGNOSIS — R7303 Prediabetes: Secondary | ICD-10-CM | POA: Diagnosis not present

## 2023-04-18 DIAGNOSIS — Z6841 Body Mass Index (BMI) 40.0 and over, adult: Secondary | ICD-10-CM

## 2023-04-18 DIAGNOSIS — Z0289 Encounter for other administrative examinations: Secondary | ICD-10-CM

## 2023-04-18 DIAGNOSIS — E66813 Obesity, class 3: Secondary | ICD-10-CM

## 2023-04-18 NOTE — Progress Notes (Signed)
Office: [phone]  /  Fax: [phone]   Initial Visit  Susan Eaton was seen in clinic today to evaluate for obesity. She is interested in losing weight to improve overall health and reduce the risk of weight related complications. She presents today to review program treatment options, initial physical assessment, and evaluation.     She was referred by: PCP  When asked what else they would like to accomplish? She states: Adopt healthier eating patterns, Improve energy levels and physical activity, Improve existing medical conditions, Improve quality of life, Improve appearance, Improve self-confidence, and Lose a target amount of weight : 50 lbs in 6-9 months.  Weight history:Gained weight with pregnancy and never got down to pre-pregnancy weight. Then moved from [place] to [place] about 8 years ago and gained following the move.   When asked how has your weight affected you? She states: Has affected self-esteem, Contributed to medical problems, Contributed to orthopedic problems or mobility issues, Having fatigue, Having poor endurance, Problems with eating patterns, and Has affected mood   Some associated conditions: Hyperlipidemia, Prediabetes, Overactive bladder, Vitamin D Deficiency, and Venous insufficiency  Contributing factors: Family history of obesity, Disruption of circadian rhythm / sleep disordered breathing, Consumption of processed foods, Moderate to high levels of stress, Reduced physical activity, Eating patterns, Mental health problems, Slow metabolism for age, Frequent travel, and Enticing relationships and enviroment  Weight promoting medications identified: None  Current nutrition plan: None  Current level of physical activity: None  Current or previous pharmacotherapy: None  Response to medication: Never tried medications   Past medical history includes:   Past Medical History:  Diagnosis Date   Annual physical exam 01/26/2021   Need for immunization  against influenza 01/26/2021   Persistent cough 01/26/2021     Objective:   BP 124/84   Pulse 84   Temp 97.9 F (36.6 C)   Ht 5\' 4"  (1.626 m)   Wt 256 lb (116.1 kg)   SpO2 98%   BMI 43.94 kg/m  She was weighed on the bioimpedance scale: Body mass index is 43.94 kg/m.  Peak Weight:256 lbs , Body Fat%:52.4%, Visceral Fat Rating:18, Weight trend over the last 12 months: Unchanged  General:  Alert, oriented and cooperative. Patient is in no acute distress.  Respiratory: Normal respiratory effort, no problems with respiration noted   Gait: able to ambulate independently  Mental Status: Normal mood and affect. Normal behavior. Normal judgment and thought content.   DIAGNOSTIC DATA REVIEWED:  BMET    Component Value Date/Time   NA 142 01/31/2023 0834   K 4.1 01/31/2023 0834   CL 106 01/31/2023 0834   CO2 28 01/31/2023 0834   GLUCOSE 113 (H) 01/31/2023 0834   BUN 18 01/31/2023 0834   CREATININE 0.95 01/31/2023 0834   CREATININE 0.84 11/05/2019 1439   CALCIUM 9.4 01/31/2023 0834   Lab Results  Component Value Date   HGBA1C 5.9 02/01/2023   HGBA1C 5.9 12/25/2018   No results found for: "INSULIN" CBC    Component Value Date/Time   WBC 4.6 01/31/2023 0834   RBC 4.67 01/31/2023 0834   HGB 14.5 01/31/2023 0834   HCT 43.4 01/31/2023 0834   PLT 194.0 01/31/2023 0834   MCV 92.9 01/31/2023 0834   MCH 30.4 11/05/2019 1439   MCHC 33.5 01/31/2023 0834   RDW 13.8 01/31/2023 0834   Iron/TIBC/Ferritin/ %Sat No results found for: "IRON", "TIBC", "FERRITIN", "IRONPCTSAT" Lipid Panel     Component Value Date/Time   CHOL 192  01/31/2023 0834   TRIG 120.0 01/31/2023 0834   HDL 54.30 01/31/2023 0834   CHOLHDL 4 01/31/2023 0834   VLDL 24.0 01/31/2023 0834   LDLCALC 114 (H) 01/31/2023 0834   LDLCALC 117 (H) 11/05/2019 1439   Hepatic Function Panel     Component Value Date/Time   PROT 7.0 01/31/2023 0834   ALBUMIN 4.2 01/31/2023 0834   AST 15 01/31/2023 0834   ALT 20  01/31/2023 0834   ALKPHOS 60 01/31/2023 0834   BILITOT 0.7 01/31/2023 0834      Component Value Date/Time   TSH 1.89 01/26/2021 0842     Assessment and Plan:   Prediabetes  Vitamin D deficiency  High serum low-density lipoprotein (LDL)  Class 3 severe obesity due to excess calories with serious comorbidity and body mass index (BMI) of 40.0 to 44.9 in adult [facility])        Obesity Treatment / Action Plan:  Patient will work on garnering support from family and friends to begin weight loss journey. Will work on eliminating or reducing the presence of highly palatable, calorie dense foods in the home. Will complete provided nutritional and psychosocial assessment questionnaire before the next appointment. Will be scheduled for indirect calorimetry to determine resting energy expenditure in a fasting state.  This will allow [place] to create a reduced calorie, high-protein meal plan to promote loss of fat mass while preserving muscle mass. Will think about ideas on how to incorporate physical activity into their daily routine. Will avoid skipping meals which may result in increased hunger signals and overeating at certain times. Will reduce the frequency of eating out and making healthier choices by advanced menu planning. Will work on managing stress via relaxation methods as this may result in unhealthy eating patterns. Will work on reading labels, making healthier choices and watching portion sizes. Counseled on the health benefits of losing 5%-15% of total body weight. Was counseled on nutritional approaches to weight loss and benefits of reducing processed foods and consuming plant-based foods and high quality protein as part of nutritional weight management. Was counseled on pharmacotherapy and role as an adjunct in weight management.   Obesity Education Performed Today:  She was weighed on the bioimpedance scale and results were discussed and documented in the synopsis.  We  discussed obesity as a disease and the importance of a more detailed evaluation of all the factors contributing to the disease.  We discussed the importance of long term lifestyle changes which include nutrition, exercise and behavioral modifications as well as the importance of customizing this to her specific health and social needs.  We discussed the benefits of reaching a healthier weight to alleviate the symptoms of existing conditions and reduce the risks of the biomechanical, metabolic and psychological effects of obesity.  Susan Eaton appears to be in the action stage of change and states they are ready to start intensive lifestyle modifications and behavioral modifications.  30 minutes was spent today on this visit including the above counseling, pre-visit chart review, and post-visit documentation.  Reviewed by clinician on day of visit: allergies, medications, problem list, medical history, surgical history, family history, social history, and previous encounter notes pertinent to obesity diagnosis.   Dorell Gatlin,PA-C

## 2023-05-17 ENCOUNTER — Encounter (INDEPENDENT_AMBULATORY_CARE_PROVIDER_SITE_OTHER): Payer: Self-pay | Admitting: Family Medicine

## 2023-05-17 ENCOUNTER — Ambulatory Visit (INDEPENDENT_AMBULATORY_CARE_PROVIDER_SITE_OTHER): Payer: BC Managed Care – PPO | Admitting: Family Medicine

## 2023-05-17 VITALS — BP 123/80 | HR 79 | Temp 98.0°F | Ht 63.0 in | Wt 260.0 lb

## 2023-05-17 DIAGNOSIS — R0602 Shortness of breath: Secondary | ICD-10-CM

## 2023-05-17 DIAGNOSIS — R7303 Prediabetes: Secondary | ICD-10-CM | POA: Diagnosis not present

## 2023-05-17 DIAGNOSIS — E65 Localized adiposity: Secondary | ICD-10-CM

## 2023-05-17 DIAGNOSIS — Z6841 Body Mass Index (BMI) 40.0 and over, adult: Secondary | ICD-10-CM

## 2023-05-17 DIAGNOSIS — M7989 Other specified soft tissue disorders: Secondary | ICD-10-CM | POA: Insufficient documentation

## 2023-05-17 DIAGNOSIS — E559 Vitamin D deficiency, unspecified: Secondary | ICD-10-CM | POA: Diagnosis not present

## 2023-05-17 DIAGNOSIS — Z1331 Encounter for screening for depression: Secondary | ICD-10-CM

## 2023-05-17 DIAGNOSIS — E66813 Obesity, class 3: Secondary | ICD-10-CM | POA: Diagnosis not present

## 2023-05-17 DIAGNOSIS — R5383 Other fatigue: Secondary | ICD-10-CM | POA: Diagnosis not present

## 2023-05-17 DIAGNOSIS — F5089 Other specified eating disorder: Secondary | ICD-10-CM | POA: Diagnosis not present

## 2023-05-17 HISTORY — DX: Other specified soft tissue disorders: M79.89

## 2023-05-17 NOTE — Progress Notes (Addendum)
 Susan Eaton, D.O.  ABFM, ABOM Specializing in Clinical Bariatric Medicine Office located at: [address]  [place], [place]  [address]   Bariatric Medicine Visit  Dear Susan Sellar, NP   Thank you for referring Susan Eaton to our clinic today for evaluation.  We performed a consultation to discuss her options for treatment and educate the patient on her disease state.  The following note includes my evaluation and treatment recommendations.   Please do not hesitate to reach out to me directly if you have any further concerns.   Assessment and Plan:   FOR THE DISEASE OF OBESITY: Class 3 severe obesity due to excess calories with serious comorbidity and body mass index (BMI) of 40.0 to 44.9 in adult [facility]) Assessment & Plan: Recommended Dietary Goals Susan Eaton is currently in the action stage of change. As such, her goal is to start our weight management plan.  She has agreed to: follow the Category 1 plan - 1000 kcal per day with lunch options and 6 ounces of lean protein at lunch.    Behavioral Intervention We discussed the following Behavioral Modification Strategies today: increasing lean protein intake to established goals, decreasing simple carbohydrates , increasing vegetables, increasing lower glycemic fruits, increasing fiber rich foods, avoiding skipping meals, keeping healthy foods at home, and decreasing eating out or consumption of processed foods, and making healthy choices when eating convenient foods  Additional resources provided today: handout on CAT 1 meal plan  and Handout on CAT 1-2 lunch options  Evidence-based interventions for health behavior change were utilized today including the discussion of self monitoring techniques, problem-solving barriers and SMART goal setting techniques.      Recommended Physical Activity Goals Susan Eaton has been advised to work up to 150 minutes of moderate intensity aerobic activity a week and strengthening  exercises 2-3 times per week for cardiovascular health, weight loss maintenance and preservation of muscle mass.   She has agreed to : maintain current level of activity.    Pharmacotherapy We discussed various medication options to help Susan Eaton with her weight loss efforts and we both agreed to :  Start with nutritional and behavioral strategies.   FOR ASSOCIATED CONDITIONS ADDRESSED TODAY: Fatigue Assessment & Plan: Susan Eaton does feel that her weight is causing her energy to be lower than it should be. Fatigue may be related to obesity, depression or many other causes. she does not appear to have any red flag symptoms and this appears to most likely be related to her current lifestyle habits and dietary intake.  Labs will be ordered and reviewed with her at their next office visit in two weeks.  Epworth sleepiness scale is 4  and appears to be within normal limits. Susan Eaton admits to daytime somnolence and admits to waking up still tired. Patient has a history of symptoms of daytime fatigue and morning fatigue. Susan Eaton generally gets  7-8  hours of sleep per night, and states that she has poor sleep quality. Snoring is present. Apneic episodes are not present.   ECG: Performed and reviewed/ interpreted independently.  Normal sinus rhythm, rate 82 bpm; reassuring without any acute abnormalities, will continue to monitor for symptoms    Shortness of breath on exertion Assessment & Plan: Susan Eaton does feel that she gets out of breath more easily than she used to when she exercises and seems to be worsening over time with weight gain.  This has gotten worse recently. Susan Eaton denies shortness of breath at rest or orthopnea.  Susan Eaton shortness of breath appears to be obesity related and exercise induced, as they do not appear to have any "red flag" symptoms/ concerns today.  Also, this condition appears to be related to a state of poor cardiovascular conditioning   Obtain labs today and will be reviewed  with her at their next office visit in two weeks.  Indirect Calorimeter completed today to help guide our dietary regimen. It shows a VO2 of 262 and a REE of 1800.  Her calculated basal metabolic rate is 1610 thus her resting energy expenditure is better than expected.  Patient agreed to work on weight loss at this time.  As Susan Eaton progresses through our weight loss program, we will gradually increase exercise as tolerated to treat her current condition.   If Susan Eaton follows our recommendations and loses 5-10% of their weight without improvement of her shortness of breath or if at any time, symptoms become more concerning, they agree to urgently follow up with their PCP/ specialist for further consideration/ evaluation.   Susan Eaton verbalizes agreement with this plan.    Depression Screen  Assessment & Plan: Her Food and Mood (modified PHQ-9) score was 13.  Emotional eating - recommended Susan Eaton     Per New Patient Packet: - Tends to eat when she is stressed and bored - Sometimes feels guilt or self-hate after overeating or making poor food choices - Feels out of control with her eating when she is stressed - Eats a larger portion than the average person about 2-3 times per week  - Her extra weight decreased her interest in doing things she used to enjoy at least half of the time  - Moods affects her eating, eating more or less than normal at least half the time      FOLLOW UP:   Follow up in 2 weeks. She was informed of the importance of frequent follow up visits to maximize her success with intensive lifestyle modifications for her multiple health conditions.  Susan Eaton is aware that we will review all of her lab results at our next visit.  She is aware that if anything is critical/ life threatening with the results, we will be contacting her via MyChart prior to the office visit to discuss management.    Chief Complaint:   OBESITY Susan Eaton (MR# [id number]) is a  pleasant 58 y.o. female who presents for evaluation and treatment of obesity and related comorbidities. Current BMI is Body mass index is 46.06 kg/m. QUITA MCGRORY has been struggling with her weight for many years and has been unsuccessful in either losing weight, maintaining weight loss, or reaching her healthy weight goal.  Susan Eaton is currently in the action stage of change and ready to dedicate time achieving and maintaining a healthier weight. Susan Eaton is interested in becoming our patient and working on intensive lifestyle modifications including (but not limited to) diet and exercise for weight loss.  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 Patient is married to husband Biomedical scientist)  and has 2 children. She lives with her husband, son (31 y/o) and her mother (44 y/o).  Pt not currently exercising.   Desired weight: 200 lbs.   Started gaining excess weight after child birth.  Identified reasons for weight gain: Stress and "likes to eat".   Peak weight was 260 lbs.   Has tried weight watchers and intermittent  fasting; was not able  to keep the weight off.   Is eating outside of the home about 2-3 times per week.  Eats fast food 1 day a week on average.   Tends to craves chips and chocolate.  Frequently snacks between meals and after dinner.  Snacks on chips, ice cream, and cookies   Drinks diet soda once a week.   Other beverages/Caloric drinks: coffee with milk, milk, unsweetened tea, and liquor (Martini)/beer (Coors Light) 2 times 3 days a week.   Worst food habit is "stress eating"    Subjective:   This is the patient's first visit at [facility].  The patient's NEW PATIENT PACKET that they filled out prior to today's office visit was reviewed at length and information from that paperwork was included within the following office visit note.    Included in the  packet: current and past health history, medications, allergies, ROS, gynecologic history (women only), surgical history, family history, social history, weight history, weight loss surgery history (for those that have had weight loss surgery), nutritional evaluation, mood and food questionnaire along with a depression screening (PHQ9) on all patients, an Epworth questionnaire, sleep habits questionnaire, patient life and health improvement goals questionnaire. These will all be scanned into the patient's chart under the "media" tab.   Review of Systems: Please refer to new patient packet scanned into media. Pertinent positives were addressed with patient today.  Reviewed by clinician on day of visit: allergies, medications, problem list, medical history, surgical history, family history, social history, and previous encounter notes.  During the visit, I independently reviewed the patient's EKG, bioimpedance scale results, and indirect calorimeter results. I used this information to tailor a meal plan for the patient that will help Susan Eaton to lose weight and will improve her obesity-related conditions going forward.  I performed a medically necessary appropriate examination and/or evaluation. I discussed the assessment and treatment plan with the patient. The patient was provided an opportunity to ask questions and all were answered. The patient agreed with the plan and demonstrated an understanding of the instructions. Labs were ordered today (unless patient declined them) and will be reviewed with the patient at our next visit unless more critical results need to be addressed immediately. Clinical information was updated and documented in the EMR.    Objective:   PHYSICAL EXAM: Blood pressure 123/80, pulse 79, temperature 98 F (36.7 C), height 5\' 3"  (1.6 m), weight 260 lb (117.9 kg), SpO2 97%. Body mass index is 46.06 kg/m.  General: Well Developed, well nourished, and in no acute  distress.  HEENT: Normocephalic, atraumatic; EOMI, sclerae are anicteric. Skin: Warm and dry, good turgor Chest:  Normal excursion, shape, no gross ABN Respiratory: No conversational dyspnea; speaking in full sentences NeuroM-Sk:  Normal gross ROM * 4 extremities  Psych: A and O *3, insight adequate, mood- full   Anthropometric Measurements Height: 5\' 3"  (1.6 m) Starting Weight: 260 lb Peak Weight: 260 lb Waist Measurement : 45 inches   No data recorded  Other Clinical Data RMR: 1800 Fasting: no Labs: no Today's Visit #: 2 Starting Date: 05/17/23    DIAGNOSTIC DATA REVIEWED:  BMET    Component Value Date/Time   NA 141 05/17/2023 1101   K 4.5 05/17/2023 1101   CL 104 05/17/2023 1101   CO2 24 05/17/2023 1101   GLUCOSE 106 (H) 05/17/2023 1101   GLUCOSE 113 (H) 01/31/2023 0834   BUN 12 05/17/2023 1101   CREATININE 0.75 05/17/2023 1101  CREATININE 0.84 11/05/2019 1439   CALCIUM 9.5 05/17/2023 1101   Lab Results  Component Value Date   HGBA1C 6.1 (H) 05/17/2023   HGBA1C 5.9 12/25/2018   Lab Results  Component Value Date   INSULIN 11.8 05/17/2023   Lab Results  Component Value Date   TSH 2.590 05/17/2023   CBC    Component Value Date/Time   WBC 5.7 05/17/2023 1101   WBC 4.6 01/31/2023 0834   RBC 4.54 05/17/2023 1101   RBC 4.67 01/31/2023 0834   HGB 13.9 05/17/2023 1101   HCT 41.7 05/17/2023 1101   PLT 228 05/17/2023 1101   MCV 92 05/17/2023 1101   MCH 30.6 05/17/2023 1101   MCH 30.4 11/05/2019 1439   MCHC 33.3 05/17/2023 1101   MCHC 33.5 01/31/2023 0834   RDW 13.1 05/17/2023 1101   Iron Studies No results found for: "IRON", "TIBC", "FERRITIN", "IRONPCTSAT" Lipid Panel     Component Value Date/Time   CHOL 203 (H) 05/17/2023 1101   TRIG 110 05/17/2023 1101   HDL 65 05/17/2023 1101   CHOLHDL 3.1 05/17/2023 1101   CHOLHDL 4 01/31/2023 0834   VLDL 24.0 01/31/2023 0834   LDLCALC 119 (H) 05/17/2023 1101   LDLCALC 117 (H) 11/05/2019 1439    Hepatic Function Panel     Component Value Date/Time   PROT 6.7 05/17/2023 1101   ALBUMIN 4.3 05/17/2023 1101   AST 11 05/17/2023 1101   ALT 15 05/17/2023 1101   ALKPHOS 62 05/17/2023 1101   BILITOT 0.6 05/17/2023 1101      Component Value Date/Time   TSH 2.590 05/17/2023 1101   Nutritional Lab Results  Component Value Date   VD25OH 43.9 05/17/2023   VD25OH 34.02 01/31/2023   VD25OH 30.64 01/26/2021    Attestation Statements:   I, Isabelle Course, acting as a Stage manager for Thomasene Lot, DO., have compiled all relevant documentation for today's office visit on behalf of Thomasene Lot, DO, while in the presence of [employer], DO.  Reviewed by clinician on day of visit: allergies, medications, problem list, medical history, surgical history, family history, social history, and previous encounter notes pertinent to patient's obesity diagnosis.  I have spent 50 minutes in the care of the patient today including: preparing to see patient (e.g. review and interpretation of tests, old notes ), obtaining and/or reviewing separately obtained history, performing a medically appropriate examination or evaluation, counseling and educating the patient, ordering medications, test or procedures, documenting clinical information in the electronic or other health care record, and independently interpreting results and communicating results to the patient, family, or caregiver   I have reviewed the above documentation for accuracy and completeness, and I agree with the above. Susan Eaton, D.O.  The 21st Century Cures Act was signed into law in 2016 which includes the topic of electronic health records.  This provides immediate access to information in MyChart.  This includes consultation notes, operative notes, office notes, lab results and pathology reports.  If you have any questions about what you read please let us know at your next visit so we can discuss your concerns and  take corrective action if need be.  We are right here with you.

## 2023-05-23 LAB — COMPREHENSIVE METABOLIC PANEL
ALT: 15 IU/L (ref 0–32)
AST: 11 IU/L (ref 0–40)
Albumin: 4.3 g/dL (ref 3.8–4.9)
Alkaline Phosphatase: 62 IU/L (ref 44–121)
BUN/Creatinine Ratio: 16 (ref 9–23)
BUN: 12 mg/dL (ref 6–24)
Bilirubin Total: 0.6 mg/dL (ref 0.0–1.2)
CO2: 24 mmol/L (ref 20–29)
Calcium: 9.5 mg/dL (ref 8.7–10.2)
Chloride: 104 mmol/L (ref 96–106)
Creatinine, Ser: 0.75 mg/dL (ref 0.57–1.00)
Globulin, Total: 2.4 g/dL (ref 1.5–4.5)
Glucose: 106 mg/dL — ABNORMAL HIGH (ref 70–99)
Potassium: 4.5 mmol/L (ref 3.5–5.2)
Sodium: 141 mmol/L (ref 134–144)
Total Protein: 6.7 g/dL (ref 6.0–8.5)
eGFR: 92 mL/min/{1.73_m2} (ref >59)

## 2023-05-23 LAB — CBC WITH DIFFERENTIAL/PLATELET
Basophils Absolute: 0.1 10*3/uL (ref 0.0–0.2)
Basos: 1 %
EOS (ABSOLUTE): 0.1 10*3/uL (ref 0.0–0.4)
Eos: 2 %
Hematocrit: 41.7 % (ref 34.0–46.6)
Hemoglobin: 13.9 g/dL (ref 11.1–15.9)
Immature Grans (Abs): 0 10*3/uL (ref 0.0–0.1)
Immature Granulocytes: 0 %
Lymphocytes Absolute: 1.5 10*3/uL (ref 0.7–3.1)
Lymphs: 26 %
MCH: 30.6 pg (ref 26.6–33.0)
MCHC: 33.3 g/dL (ref 31.5–35.7)
MCV: 92 fL (ref 79–97)
Monocytes Absolute: 0.3 10*3/uL (ref 0.1–0.9)
Monocytes: 6 %
Neutrophils Absolute: 3.8 10*3/uL (ref 1.4–7.0)
Neutrophils: 65 %
Platelets: 228 10*3/uL (ref 150–450)
RBC: 4.54 x10E6/uL (ref 3.77–5.28)
RDW: 13.1 % (ref 11.7–15.4)
WBC: 5.7 10*3/uL (ref 3.4–10.8)

## 2023-05-23 LAB — INSULIN, RANDOM: INSULIN: 11.8 u[IU]/mL (ref 2.6–24.9)

## 2023-05-23 LAB — LIPID PANEL
Chol/HDL Ratio: 3.1 ratio (ref 0.0–4.4)
Cholesterol, Total: 203 mg/dL — ABNORMAL HIGH (ref 100–199)
HDL: 65 mg/dL (ref >39)
LDL Chol Calc (NIH): 119 mg/dL — ABNORMAL HIGH (ref 0–99)
Triglycerides: 110 mg/dL (ref 0–149)
VLDL Cholesterol Cal: 19 mg/dL (ref 5–40)

## 2023-05-23 LAB — HEMOGLOBIN A1C
Est. average glucose Bld gHb Est-mCnc: 128 mg/dL
Hgb A1c MFr Bld: 6.1 % — ABNORMAL HIGH (ref 4.8–5.6)

## 2023-05-23 LAB — TSH: TSH: 2.59 u[IU]/mL (ref 0.450–4.500)

## 2023-05-23 LAB — T4, FREE: Free T4: 1.38 ng/dL (ref 0.82–1.77)

## 2023-05-23 LAB — VITAMIN B12

## 2023-05-23 LAB — FOLATE

## 2023-05-23 LAB — VITAMIN D 25 HYDROXY (VIT D DEFICIENCY, FRACTURES): Vit D, 25-Hydroxy: 43.9 ng/mL (ref 30.0–100.0)

## 2023-05-30 NOTE — Progress Notes (Signed)
 Susan Eaton, D.O.  ABFM, ABOM Specializing in Clinical Bariatric Medicine Office located at: [address]  [place], [place]  [address]   Bariatric Medicine Visit  Dear Susan Sellar, NP   Thank you for referring Susan Eaton to our clinic today for evaluation.  We performed a consultation to discuss her options for treatment and educate the patient on her disease state.  The following note includes my evaluation and treatment recommendations.   Please do not hesitate to reach out to me directly if you have any further concerns.   Assessment and Plan:   Labs obtained today (A1C, Insulin, Lipid panel, CMP, CBC, Folate, T4 free, TSH, Vit B12, and Vit D) will be reviewed at next OV.   FOR THE DISEASE OF OBESITY: Class 3 severe obesity due to excess calories with serious comorbidity and body mass index (BMI) of 40.0 to 44.9 in adult [facility]) Assessment & Plan: Recommended Dietary Goals Susan Eaton is currently in the action stage of change. As such, her goal is to start our weight management plan.  She has agreed to: follow the Category 1 plan - 1000 kcal per day with lunch options and 6 ounces of lean protein at lunch.    Behavioral Intervention We discussed the following Behavioral Modification Strategies today: increasing lean protein intake to established goals, decreasing simple carbohydrates , increasing vegetables, increasing lower glycemic fruits, increasing fiber rich foods, avoiding skipping meals, keeping healthy foods at home, and decreasing eating out or consumption of processed foods, and making healthy choices when eating convenient foods  Additional resources provided today: handout on CAT 1 meal plan  and Handout on CAT 1-2 lunch options  Evidence-based interventions for health behavior change were utilized today including the discussion of self monitoring techniques, problem-solving barriers and SMART goal setting techniques.     Recommended Physical  Activity Goals Susan Eaton has been advised to work up to 150 minutes of moderate intensity aerobic activity a week and strengthening exercises 2-3 times per week for cardiovascular health, weight loss maintenance and preservation of muscle mass.   She has agreed to: maintain current level of activity.    Pharmacotherapy We discussed various medication options to help Susan Eaton with her weight loss efforts and we both agreed to:  Start with nutritional and behavioral strategies.   FOR ASSOCIATED CONDITIONS ADDRESSED TODAY: Fatigue Assessment & Plan: Susan Eaton does feel that her weight is causing her energy to be lower than it should be. Fatigue may be related to obesity, depression or many other causes. she does not appear to have any red flag symptoms and this appears to most likely be related to her current lifestyle habits and dietary intake.  Labs will be ordered and reviewed with her at their next office visit in two weeks.  Epworth sleepiness scale is 4  and appears to be within normal limits. Susan Eaton admits to daytime somnolence and admits to waking up still tired. Patient has a history of symptoms of daytime fatigue and morning fatigue. Susan Eaton generally gets  7-8  hours of sleep per night, and states that she has poor sleep quality. Snoring is present. Apneic episodes are not present.   ECG: Performed and reviewed/ interpreted independently.  Normal sinus rhythm, rate 82 bpm; reassuring without any acute abnormalities, will continue to monitor for symptoms    Orders : -     EKG 12-Lead -     Hemoglobin A1c -     Insulin, random -  Lipid panel -     Comprehensive metabolic panel -     CBC with Differential/Platelet -     Folate -     T4, free -     TSH -     Vitamin B12 -     VITAMIN D 25 Hydroxy (Vit-D Deficiency, Fractures)   Shortness of breath on exertion Assessment & Plan: Susan Eaton does feel that she gets out of breath more easily than she used to when she exercises and seems  to be worsening over time with weight gain.  This has gotten worse recently. Susan Eaton denies shortness of breath at rest or orthopnea. Susan Eaton's shortness of breath appears to be obesity related and exercise induced, as they do not appear to have any "red flag" symptoms/ concerns today.  Also, this condition appears to be related to a state of poor cardiovascular conditioning   Obtain labs today and will be reviewed with her at their next office visit in two weeks.  Indirect Calorimeter completed today to help guide our dietary regimen. It shows a VO2 of 262 and a REE of 1800.  Her calculated basal metabolic rate is 1610 thus her resting energy expenditure is better than expected.  Patient agreed to work on weight loss at this time.  As Susan Eaton progresses through our weight loss program, we will gradually increase exercise as tolerated to treat her current condition.   If Susan Eaton follows our recommendations and loses 5-10% of their weight without improvement of her shortness of breath or if at any time, symptoms become more concerning, they agree to urgently follow up with their PCP/ specialist for further consideration/ evaluation.   Susan Eaton verbalizes agreement with this plan.    Depression Screen  Assessment & Plan: Her Food and Mood (modified PHQ-9) score was 13.  Moods stable currently. No SI/HI. Not on any medication. Pt does endorses emotional eating, stating her worst food habit is stress eating. She is also eating out multiple times a week and snacks on salty/sugary foods (ice cream, chips, and cookies).   Per New Patient Packet: - Tends to eat when she is stressed and bored - Sometimes feels guilt or self-hate after overeating or making poor food choices - Feels out of control with her eating when she is stressed - Eats a larger portion than the average person about 2-3 times per week  - Her extra weight decreased her interest in doing things she used to enjoy at least half of the time  -  Moods affects her eating, eating more or less than normal at least half the time  Patient was referred to Dr. Dewaine Conger, our Bariatric Psychologist, for evaluation due to her elevated PHQ-9 score and significant struggles with emotional eating. Reviewed the importance of stress management via exercise, meditation, and breathing exercises. Discussed how thoughts affect eating habits, modeling of thoughts, feelings, and behaviors, and strategies for change. Pt advised to avoid skipping meals and getting all her proteins and fiber in on a daily basis. Reviewed how increasing protein intake may help with hunger/cravings. Discussed the importance of following their prudent nutrition plan and how food can affect mood as well to support emotional wellbeing. We will continue to monitor closely alongside PCP / other specialists.   Vitamin D deficiency Assessment & Plan: Last vitamin D drawn by her PPC about 2 years ago was below goal at 30.64. Pt is currently taking OTC Vitamin D 1,000 units once daily with good compliance and tolerance. She reports no acute  concerns today.   Ideal vitamin D levels of 50-70 reviewed with pt. Discussed the importance of vitamin D to overall health as well as the benefits of at goal levels such as improved energy, moods, and weight loss. These levels are expected to improve as pt loses weight, thus pt was encouraged to continue her weight loss efforts and start implementation of the meal plan prescribed to her today. Will check vitamin D levels today and review results with pt at her next OV. We will need to monitor levels on average every 3-4 months to ensure levels are WNL and to prevent over supplementation.    Prediabetes Assessment & Plan: Pt reports a hx of prediabetes. Her most recent A1C was 5.9 on 02/01/23. Prior A1C of 5.8 3 years ago and 5.9 4 years ago. No prior record of insulin labs on EHR. Pt is not on any medications currently. Pt reports unhealthy eating and  snacking per her New Patient Packet (see more below in Subjective).   Pt counseled on pathophysiology of the disease process of prediabetes and reviewed how it may possibly progress to diabetes if uncontrolled. Stressed importance of dietary and lifestyle modifications to result in weight loss as first line of treatment prior to discussing any medication options. She was encouraged to start the implementation of her prescribed prudent nutritional meal plan today as an effort to lose weight. Encouraged to decrease simple carbs/sugars, avoid eating out regularly, and prioritizing protein intake. Will check A1C and insulin today and review results at her next OV. We will continue to monitor her condition alongside PCP/specialists and recheck A1C and insulin every 3-4 months on average.    Visceral obesity Assessment & Plan: Pt visceral fat rating was 19 today, increased from 18 on 04/18/23 during her info session. She reports no current exercise.   Reviewed goal visceral fat rating with patient. Reviewed how pt needs to be at a 500 calorie deficit per day to lose 1 lb of fat a week. Encouraged pt to start the implementation of her prudent nutritional meal plan prescribed to her today to continue her efforts towards weight loss. Pt was additionally counseled on the benefits of exercising in building muscle mass and losing fat mass.    FOLLOW UP:   Follow up in 2 weeks. She was informed of the importance of frequent follow up visits to maximize her success with intensive lifestyle modifications for her multiple health conditions.  Vicki Mallet is aware that we will review all of her lab results at our next visit.  She is aware that if anything is critical/ life threatening with the results, we will be contacting her via MyChart prior to the office visit to discuss management.    Chief Complaint:   OBESITY RAMYAH PANKOWSKI (MR# [id number]) is a pleasant 58 y.o. female who presents for evaluation and  treatment of obesity and related comorbidities. Current BMI is Body mass index is 46.06 kg/m. ROSHAWN LACINA has been struggling with her weight for many years and has been unsuccessful in either losing weight, maintaining weight loss, or reaching her healthy weight goal.  MANDEE PLUTA is currently in the action stage of change and ready to dedicate time achieving and maintaining a healthier weight. CANISHA ISSAC is interested in becoming our patient and working on intensive lifestyle modifications including (but not limited to) diet and exercise for weight loss.  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 Patient is married to husband Biomedical scientist)  and has 2 children. She lives with her husband, son (4 y/o) and her mother (58 y/o).  Pt not currently exercising.   Desired weight: 200 lbs.   Started gaining excess weight after child birth.  Identified reasons for weight gain: Stress and "likes to eat".   Peak weight was 260 lbs.   Has tried weight watchers and intermittent  fasting; was not able to keep the weight off.   Is eating outside of the home about 2-3 times per week.  Eats fast food 1 day a week on average.   Tends to craves chips and chocolate.  Frequently snacks between meals and after dinner.  Snacks on chips, ice cream, and cookies   Drinks diet soda once a week.   Other beverages/Caloric drinks: coffee with milk, milk, unsweetened tea, and liquor (Martini)/beer ([employer]) 2 times 3 days a week.   Worst food habit is "stress eating".    Subjective:   This is the patient's first visit at [facility].  The patient's NEW PATIENT PACKET that they filled out prior to today's office visit was reviewed at length and information from that paperwork was included within the following office visit note.    Included in the packet: current and past health history, medications,  allergies, ROS, gynecologic history (women only), surgical history, family history, social history, weight history, weight loss surgery history (for those that have had weight loss surgery), nutritional evaluation, mood and food questionnaire along with a depression screening (PHQ9) on all patients, an Epworth questionnaire, sleep habits questionnaire, patient life and health improvement goals questionnaire. These will all be scanned into the patient's chart under the "media" tab.   Review of Systems: Please refer to new patient packet scanned into media. Pertinent positives were addressed with patient today.  Reviewed by clinician on day of visit: allergies, medications, problem list, medical history, surgical history, family history, social history, and previous encounter notes.  During the visit, I independently reviewed the patient's EKG, bioimpedance scale results, and indirect calorimeter results. I used this information to tailor a meal plan for the patient that will help Vicki Mallet to lose weight and will improve her obesity-related conditions going forward.  I performed a medically necessary appropriate examination and/or evaluation. I discussed the assessment and treatment plan with the patient. The patient was provided an opportunity to ask questions and all were answered. The patient agreed with the plan and demonstrated an understanding of the instructions. Labs were ordered today (unless patient declined them) and will be reviewed with the patient at our next visit unless more critical results need to be addressed immediately. Clinical information was updated and documented in the EMR.    Objective:   PHYSICAL EXAM: Blood pressure 123/80, pulse 79, temperature 98 F (36.7 C), height 5\' 3"  (1.6 m), weight 260 lb (117.9 kg), SpO2 97%. Body mass index is 46.06 kg/m.  General: Well Developed, well nourished, and in no acute distress.  HEENT: Normocephalic, atraumatic; EOMI, sclerae  are anicteric. Skin: Warm and dry, good turgor Chest:  Normal excursion, shape, no gross ABN Respiratory: No conversational dyspnea; speaking in full sentences NeuroM-Sk:  Normal gross ROM * 4 extremities  Psych: A and O *3, insight adequate, mood- full   Anthropometric Measurements Height: 5\' 3"  (1.6 m) Weight: 260 lb (117.9 kg) BMI (Calculated): 46.07 Weight at Last Visit: N/a Weight Lost Since Last Visit: n/a Weight Gained Since Last  Visit: N/A Starting Weight: 260 lb Peak Weight: 260 lb Waist Measurement : 45 inches  Body Composition  Body Fat %: 53.4 % Fat Mass (lbs): 138.8 lbs Muscle Mass (lbs): 115 lbs Total Body Water (lbs): 93.8 lbs Visceral Fat Rating : 19  Other Clinical Data RMR: 1800 Fasting: yes Labs: yes Today's Visit #: 1 Starting Date: 05/17/23    DIAGNOSTIC DATA REVIEWED:  BMET    Component Value Date/Time   NA 141 05/17/2023 1101   K 4.5 05/17/2023 1101   CL 104 05/17/2023 1101   CO2 24 05/17/2023 1101   GLUCOSE 106 (H) 05/17/2023 1101   GLUCOSE 113 (H) 01/31/2023 0834   BUN 12 05/17/2023 1101   CREATININE 0.75 05/17/2023 1101   CREATININE 0.84 11/05/2019 1439   CALCIUM 9.5 05/17/2023 1101   Lab Results  Component Value Date   HGBA1C 6.1 (H) 05/17/2023   HGBA1C 5.9 12/25/2018   Lab Results  Component Value Date   INSULIN 11.8 05/17/2023   Lab Results  Component Value Date   TSH 2.590 05/17/2023   CBC    Component Value Date/Time   WBC 5.7 05/17/2023 1101   WBC 4.6 01/31/2023 0834   RBC 4.54 05/17/2023 1101   RBC 4.67 01/31/2023 0834   HGB 13.9 05/17/2023 1101   HCT 41.7 05/17/2023 1101   PLT 228 05/17/2023 1101   MCV 92 05/17/2023 1101   MCH 30.6 05/17/2023 1101   MCH 30.4 11/05/2019 1439   MCHC 33.3 05/17/2023 1101   MCHC 33.5 01/31/2023 0834   RDW 13.1 05/17/2023 1101   Iron Studies No results found for: "IRON", "TIBC", "FERRITIN", "IRONPCTSAT" Lipid Panel     Component Value Date/Time   CHOL 203 (H) 05/17/2023  1101   TRIG 110 05/17/2023 1101   HDL 65 05/17/2023 1101   CHOLHDL 3.1 05/17/2023 1101   CHOLHDL 4 01/31/2023 0834   VLDL 24.0 01/31/2023 0834   LDLCALC 119 (H) 05/17/2023 1101   LDLCALC 117 (H) 11/05/2019 1439   Hepatic Function Panel     Component Value Date/Time   PROT 6.7 05/17/2023 1101   ALBUMIN 4.3 05/17/2023 1101   AST 11 05/17/2023 1101   ALT 15 05/17/2023 1101   ALKPHOS 62 05/17/2023 1101   BILITOT 0.6 05/17/2023 1101      Component Value Date/Time   TSH 2.590 05/17/2023 1101   Nutritional Lab Results  Component Value Date   VD25OH 43.9 05/17/2023   VD25OH 34.02 01/31/2023   VD25OH 30.64 01/26/2021    Attestation Statements:   I, Isabelle Course, acting as a Stage manager for Thomasene Lot, DO., have compiled all relevant documentation for today's office visit on behalf of Thomasene Lot, DO, while in the presence of [employer], DO.  Reviewed by clinician on day of visit: allergies, medications, problem list, medical history, surgical history, family history, social history, and previous encounter notes pertinent to patient's obesity diagnosis.  I have spent 50 minutes in the care of the patient today including: preparing to see patient (e.g. review and interpretation of tests, old notes ), obtaining and/or reviewing separately obtained history, performing a medically appropriate examination or evaluation, counseling and educating the patient, ordering medications, test or procedures, documenting clinical information in the electronic or other health care record, and independently interpreting results and communicating results to the patient, family, or caregiver   I have reviewed the above documentation for accuracy and completeness, and I agree with the above. Susan Eaton, D.O.  The 21st Century  Cures Act was signed into law in 2016 which includes the topic of electronic health records.  This provides immediate access to information in MyChart.  This  includes consultation notes, operative notes, office notes, lab results and pathology reports.  If you have any questions about what you read please let us know at your next visit so we can discuss your concerns and take corrective action if need be.  We are right here with you.

## 2023-05-31 ENCOUNTER — Encounter (INDEPENDENT_AMBULATORY_CARE_PROVIDER_SITE_OTHER): Payer: Self-pay | Admitting: Family Medicine

## 2023-05-31 ENCOUNTER — Ambulatory Visit (INDEPENDENT_AMBULATORY_CARE_PROVIDER_SITE_OTHER): Payer: BC Managed Care – PPO | Admitting: Family Medicine

## 2023-05-31 VITALS — BP 132/83 | HR 82 | Temp 97.8°F | Ht 63.0 in | Wt 253.0 lb

## 2023-05-31 DIAGNOSIS — R7989 Other specified abnormal findings of blood chemistry: Secondary | ICD-10-CM | POA: Diagnosis not present

## 2023-05-31 DIAGNOSIS — R7303 Prediabetes: Secondary | ICD-10-CM | POA: Diagnosis not present

## 2023-05-31 DIAGNOSIS — Z6841 Body Mass Index (BMI) 40.0 and over, adult: Secondary | ICD-10-CM

## 2023-05-31 DIAGNOSIS — E559 Vitamin D deficiency, unspecified: Secondary | ICD-10-CM

## 2023-05-31 MED ORDER — VITAMIN D 25 MCG (1000 UNIT) PO TABS: ORAL_TABLET | ORAL | Status: DC

## 2023-05-31 NOTE — Addendum Note (Signed)
 Addended by: [name] on: 05/31/2023 02:56 PM   Modules accepted: Level of Service

## 2023-05-31 NOTE — Progress Notes (Signed)
 Susan Eaton, D.O.  ABFM, ABOM Clinical Bariatric Medicine Physician  Office located at: [address] [place], [place]  [address]     Assessment and Plan:   FOR THE DISEASE OF OBESITY:  BMI 45.0-49.9, adult (HCC) Morbid obesity (HCC) Assessment & Plan: Since last office visit on 05/17/2023 patient's  Muscle mass has decreased by 1.8 lb. Fat mass has decreased by 5 lb. Total body water has decreased by 0.4 lb.  Counseling done on how various foods will affect these numbers and how to maximize success  Total lbs lost to date: 7 lbs  Total weight loss percentage to date: 2.69%    Recommended Dietary Goals Susan Eaton is currently in the action stage of change. As such, her goal is to continue weight management plan.  Pt provided the option to either journal 1250-1350 calories and 85++ grams protein daily OR follow the Category 1 Plan w/ L options & 6 ounces of lean protein at lunch.   Behavioral Intervention We discussed the following today: strategies to get in all the proteins on her meal plan,  increasing water intake , work on tracking and journaling calories using tracking application, staying on track while eating out, and using Weight Watchers, GPT, or another AI platform for recipe ideas- searching "low calorie, low carb, high protein chicken recipes" etc.   Additional resources provided today: Handout and personalized instruction on tracking and journaling using Apps or handwriting and using logs provided and handouts on different breakfast recipes, handout on Chat-GPT inspired recipes, handout on I.R/Pre-DM.  Evidence-based interventions for health behavior change were utilized today including the discussion of self monitoring techniques, problem-solving barriers and SMART goal setting techniques.  Regarding patient's less desirable eating habits and patterns, we employed the technique of small changes.   Pt will specifically work on: n/a    Recommended Physical  Activity Goals Susan Eaton has been advised to work up to 150 minutes of moderate intensity aerobic activity a week and strengthening exercises 2-3 times per week for cardiovascular health, weight loss maintenance and preservation of muscle mass.   She has agreed to : Continue current level of physical activity    Pharmacotherapy We both agreed to : continue with nutritional and behavioral strategies   FOR ASSOCIATED CONDITIONS ADDRESSED TODAY:   Prediabetes Assessment & Plan: Lab Results  Component Value Date   HGBA1C 6.1 (H) 05/17/2023   HGBA1C 5.9 02/01/2023   HGBA1C 5.8 (H) 11/05/2019   INSULIN 11.8 05/17/2023   Lab Results  Component Value Date   CREATININE 0.75 05/17/2023   BUN 12 05/17/2023   NA 141 05/17/2023   K 4.5 05/17/2023   CL 104 05/17/2023   CO2 24 05/17/2023      Component Value Date/Time   PROT 6.7 05/17/2023 1101   ALBUMIN 4.3 05/17/2023 1101   AST 11 05/17/2023 1101   ALT 15 05/17/2023 1101   ALKPHOS 62 05/17/2023 1101   BILITOT 0.6 05/17/2023 1101    Lab Results  Component Value Date   WBC 5.7 05/17/2023   HGB 13.9 05/17/2023   HCT 41.7 05/17/2023   MCV 92 05/17/2023   PLT 228 05/17/2023   Lab Results  Component Value Date   TSH 2.590 05/17/2023   FREET4 1.38 05/17/2023   Her Hemoglobin A1c has worsened from 5.9 to 6.1. Her fasting blood sugar slightly elevated at 106. Her fasting insulin is roughly 2 times normal. Her kidney function, electrolytes, liver enzymes, blood counts, and thyroid levels are  WNL.   Patient aware of disease state and risk of progression. Explained role of simple carbs and insulin levels on hunger and cravings. Educated patient that having adequate amounts of protein with each meal is important for increasing muscle mass, stabilizing sugars, controlling hunger and cravings, and improving thermogenesis. Encouraged pt to read more about pre-dm on the American Diabetes Association website. Continue balanced diet focusing on  protein, fruits, and vegetables while limiting simple carbohydrates. Losing 10% or more of body weight may improve condition.    High serum low-density lipoprotein (LDL) Assessment & Plan: Lab Results  Component Value Date   CHOL 203 (H) 05/17/2023   HDL 65 05/17/2023   LDLCALC 119 (H) 05/17/2023   TRIG 110 05/17/2023   CHOLHDL 3.1 05/17/2023   The 10-year ASCVD risk score (Arnett DK, et al., 2019) is: 2.8%   Values used to calculate the score:     Age: 58 years     Sex: Female     Is Non-Hispanic African American: No     Diabetic: No     Tobacco smoker: No     Systolic Blood Pressure: 132 mmHg     Is BP treated: No     HDL Cholesterol: 65 mg/dL     Total Cholesterol: 203 mg/dL  No current medications. Diet/lifestyle approach. Lipid panel WNL w/ expection of LDL. Goal LDL <99.  Pt advised to reduce saturated and trans fats in diet.  Recommend pt to explore the American Heart Association website for further information about high cholesterol. Maintain with heart healthy low cholesterol meal plan. Will have her increase exercise in the future.    Vitamin D deficiency Assessment & Plan: Lab Results  Component Value Date   VD25OH 43.9 05/17/2023   VD25OH 34.02 01/31/2023   VD25OH 30.64 01/26/2021   Pt not sure the exact dose of OTC Vitamin D she is taking - could be either 1,000 units or 2,000 units daily. Her Vitamin D levels are not at goal of 50-70.  I discussed the importance of vitamin D to the patient's health and well-being as well as to their ability to lose weight. Pt has been instructed to double her daily OTC VD dose. Recheck in 3 mos.   Relevant Orders:  -     Vitamin D; Double your OTC dose from 1000 to 2,000   FOLLOW UP:   Return 06/20/2023 with Shawn Rayburn PA. She was informed of the importance of frequent follow up visits to maximize her success with intensive lifestyle modifications for her multiple health conditions.  Subjective:   Chief complaint:  Obesity Susan is here to discuss her progress with her obesity treatment plan. She is on the Category 1 Plan w/ L options and 6 ounces of lean protein at lunch and states she is following her eating plan approximately 75% of the time. She states she is not exercising   Interval History:  Susan Eaton is here today for her first follow-up office visit since starting the program with [place]. Patient is off to a good start and has lost 7 lbs. She officially started her PNP on March 3rd. States that it is difficult to get in all her meal plan foods, but she is working on this. Reports measuring her proteins. Endorses that her hunger/cravings are controlled. She is drinking more water.   Pharmacotherapy for weight loss: She is not currently taking medications  for medical weight loss.  Review of Systems:  Pertinent positives were addressed with  patient today.  Reviewed by clinician on day of visit: allergies, medications, problem list, medical history, surgical history, family history, social history, and previous encounter notes.  Weight Summary and Biometrics   Weight Lost Since Last Visit: 7 lb  Weight Gained Since Last Visit: 0   Vitals Temp: 97.8 F (36.6 C) BP: 132/83 Pulse Rate: 82 SpO2: 100 %   Anthropometric Measurements Height: 5\' 3"  (1.6 m) Weight: 253 lb (114.8 kg) BMI (Calculated): 44.83 Starting Weight: 260 lb Peak Weight: 260 lb Waist Measurement : 45 inches   Body Composition  Body Fat %: 52.9 % Fat Mass (lbs): 133.8 lbs Muscle Mass (lbs): 113.2 lbs Total Body Water (lbs): 93.4 lbs Visceral Fat Rating : 18   Other Clinical Data RMR: 1800 Fasting: no Labs: no Today's Visit #: 2 Starting Date: 05/17/23   Objective:   PHYSICAL EXAM:  Blood pressure 132/83, pulse 82, temperature 97.8 F (36.6 C), height 5\' 3"  (1.6 m), weight 253 lb (114.8 kg), SpO2 100%. Body mass index is 44.82 kg/m.  General: she is overweight, cooperative and in no acute  distress.   HEENT: EOMI, sclerae are anicteric. Lungs: Normal breathing effort, no conversational dyspnea. M-Sk:  Normal gross ROM * 4 extremities  PSYCH: Has normal mood, affect and thought process. Neurologic: No gross sensory or motor deficits. Well developed, A and O * 3  DIAGNOSTIC DATA REVIEWED:  BMET    Component Value Date/Time   NA 141 05/17/2023 1101   K 4.5 05/17/2023 1101   CL 104 05/17/2023 1101   CO2 24 05/17/2023 1101   GLUCOSE 106 (H) 05/17/2023 1101   GLUCOSE 113 (H) 01/31/2023 0834   BUN 12 05/17/2023 1101   CREATININE 0.75 05/17/2023 1101   CREATININE 0.84 11/05/2019 1439   CALCIUM 9.5 05/17/2023 1101   Lab Results  Component Value Date   HGBA1C 6.1 (H) 05/17/2023   HGBA1C 5.9 12/25/2018   Lab Results  Component Value Date   INSULIN 11.8 05/17/2023   Lab Results  Component Value Date   TSH 2.590 05/17/2023   CBC    Component Value Date/Time   WBC 5.7 05/17/2023 1101   WBC 4.6 01/31/2023 0834   RBC 4.54 05/17/2023 1101   RBC 4.67 01/31/2023 0834   HGB 13.9 05/17/2023 1101   HCT 41.7 05/17/2023 1101   PLT 228 05/17/2023 1101   MCV 92 05/17/2023 1101   MCH 30.6 05/17/2023 1101   MCH 30.4 11/05/2019 1439   MCHC 33.3 05/17/2023 1101   MCHC 33.5 01/31/2023 0834   RDW 13.1 05/17/2023 1101   Iron Studies No results found for: "IRON", "TIBC", "FERRITIN", "IRONPCTSAT" Lipid Panel     Component Value Date/Time   CHOL 203 (H) 05/17/2023 1101   TRIG 110 05/17/2023 1101   HDL 65 05/17/2023 1101   CHOLHDL 3.1 05/17/2023 1101   CHOLHDL 4 01/31/2023 0834   VLDL 24.0 01/31/2023 0834   LDLCALC 119 (H) 05/17/2023 1101   LDLCALC 117 (H) 11/05/2019 1439   Hepatic Function Panel     Component Value Date/Time   PROT 6.7 05/17/2023 1101   ALBUMIN 4.3 05/17/2023 1101   AST 11 05/17/2023 1101   ALT 15 05/17/2023 1101   ALKPHOS 62 05/17/2023 1101   BILITOT 0.6 05/17/2023 1101      Component Value Date/Time   TSH 2.590 05/17/2023 1101    Nutritional Lab Results  Component Value Date   VD25OH 43.9 05/17/2023   VD25OH 34.02 01/31/2023   VD25OH 30.64 01/26/2021  Attestations:   I, Special Puri, acting as a Stage manager for [employer], DO., have compiled all relevant documentation for today's office visit on behalf of Thomasene Lot, DO, while in the presence of [employer], DO.  Reviewed by clinician on day of visit: allergies, medications, problem list, medical history, surgical history, family history, social history, and previous encounter notes pertinent to patient's obesity diagnosis.   I have spent 56 minutes in the care of the patient today including: preparing to see patient (e.g. review and interpretation of tests, old notes ), obtaining and/or reviewing separately obtained history, performing a medically appropriate examination or evaluation, counseling and educating the patient, ordering medications, test or procedures, documenting clinical information in the electronic or other health care record, and independently interpreting results and communicating results to the patient, family, or caregiver   I have reviewed the above documentation for accuracy and completeness, and I agree with the above. Susan Eaton, D.O.  The 21st Century Cures Act was signed into law in 2016 which includes the topic of electronic health records.  This provides immediate access to information in MyChart.  This includes consultation notes, operative notes, office notes, lab results and pathology reports.  If you have any questions about what you read please let us know at your next visit so we can discuss your concerns and take corrective action if need be.  We are right here with you.

## 2023-06-19 ENCOUNTER — Ambulatory Visit (INDEPENDENT_AMBULATORY_CARE_PROVIDER_SITE_OTHER): Payer: BC Managed Care – PPO | Admitting: Family Medicine

## 2023-06-19 NOTE — Progress Notes (Unsigned)
 SUBJECTIVE: Discussed the use of AI scribe software for clinical note transcription with the patient, who gave verbal consent to proceed.  Chief Complaint: Obesity  Interim History: She is down 7 lbs since her last visit.  Down 14 lbs overall TBW loss of 5.4% Susan Eaton is here to discuss her progress with her obesity treatment plan. She is on the Category 1 Plan and states she is following her eating plan approximately 50 % of the time. She states she is not exercising.  Susan Mallet "Susan Eaton" is a 58 year old female who presents for follow-up of her obesity treatment plan.  She has lost seven pounds since her last visit, despite experiencing a period of emotional stress from work and home life, which has sometimes led to inadequate food intake.  She follows a calorie-restricted diet of 1250 to 1350 calories per day with a goal of 85 grams of protein. She finds meal planning challenging and sometimes gets bored with her food options.  She has been taking vitamin D 6000 IU daily instead of 8000 units daily due to concerns her level would be too high. Her last vitamin D level in February was 43, with a goal of 50 to 70.  Recent lab work showed a glucose level of 106, slightly elevated LDL cholesterol, and an A1c of 6.1. Her insulin level was 11.5, above the goal of 5 or below. Kidney and liver functions were normal. She acknowledges a past high intake of butter, which she has since reduced.  She works from home for [employer], which is undergoing a [employer] by [employer], causing additional stress. She has been with the company for almost forty years and experiences stress due to the buyout and the lack of replacement for retiring employees. OBJECTIVE: Visit Diagnoses: Problem List Items Addressed This Visit     Prediabetes - Primary   Vitamin D deficiency   Morbid obesity (HCC)   High serum low-density lipoprotein (LDL)   Other Visit Diagnoses       Other disorder of eating -  Emotional eating         BMI 40.0-44.9, adult (HCC) Current BMI 43.6         Obesity She is on a weight loss plan and has lost seven pounds since the last visit, totaling a fourteen-pound weight loss. She follows a calorie-restricted diet of 1250-1350 calories with a goal of 85 grams of protein per day. She reports challenges with emotional eating due to work and home stressors but is more conscious of food choices. The current plan should promote improvement in glucose, insulin, and cholesterol levels. - Continue current diet plan of 1250-1350 calories and 85 grams of protein per day - Utilize Skinny Taste and [employer] for meal planning - Use Lose It app for tracking food intake - Schedule follow-up appointments every 2-3 weeks  Prediabetes/insulin resistance Her glucose level is 106 mg/dL, and insulin level is 40.9, both slightly elevated. The A1c is 6.1, indicating prediabetes. The weight loss and dietary plan should help improve these levels. Lab Results  Component Value Date   HGBA1C 6.1 (H) 05/17/2023   HGBA1C 5.9 02/01/2023   HGBA1C 5.8 (H) 11/05/2019   Lab Results  Component Value Date   LDLCALC 119 (H) 05/17/2023   CREATININE 0.75 05/17/2023   INSULIN  Date Value Ref Range Status  05/17/2023 11.8 2.6 - 24.9 uIU/mL Final  ]Continue working on nutrition plan to decrease simple carbohydrates, increase lean proteins and exercise to  promote weight loss, improve glycemic control and prevent progression to Type 2 diabetes.  - Continue with the current weight loss and dietary plan to improve glucose and insulin levels  Hyperlipidemia/Elevated LDL Cholesterol Her LDL cholesterol is slightly elevated. She has reduced butter consumption. The current weight loss plan is expected to improve cholesterol levels. Lab Results  Component Value Date   CHOL 203 (H) 05/17/2023   CHOL 192 01/31/2023   CHOL 182 01/27/2022   Lab Results  Component Value Date   HDL 65 05/17/2023    HDL 54.30 01/31/2023   HDL 60.60 01/27/2022   Lab Results  Component Value Date   LDLCALC 119 (H) 05/17/2023   LDLCALC 114 (H) 01/31/2023   LDLCALC 110 (H) 01/27/2022   Lab Results  Component Value Date   TRIG 110 05/17/2023   TRIG 120.0 01/31/2023   TRIG 58.0 01/27/2022   Lab Results  Component Value Date   CHOLHDL 3.1 05/17/2023   CHOLHDL 4 01/31/2023   CHOLHDL 3 01/27/2022   No results found for: "LDLDIRECT" Continue to work on nutrition plan -decreasing simple carbohydrates, increasing lean proteins, decreasing saturated fats and cholesterol , avoiding trans fats and exercise as able to promote weight loss, improve lipids and decrease cardiovascular risks.  - Continue with the current weight loss and dietary plan to improve cholesterol levels  Vitamin D Insufficiency Her last vitamin D level was 43 ng/mL, with a goal of 50-70 ng/mL. She was advised to increase vitamin D intake but was uncomfortable with the recommended 8000 IU daily, opting for 6000 IU instead. She is aware of the risks of excessive vitamin D intake, including tissue accumulation. The decision to maintain 6000 IU was made considering her comfort and the potential for vitamin D to build up in tissues. Last vitamin D Lab Results  Component Value Date   VD25OH 43.9 05/17/2023   Low vitamin D levels can be associated with adiposity and may result in leptin resistance and weight gain. Also associated with fatigue.  Currently on vitamin D supplementation without any adverse effects such as nausea, vomiting or muscle weakness.  - Continue current vitamin D supplementation at 6000 IU daily - Reassess vitamin D levels at the next blood draw  Vitals Temp: 98.5 F (36.9 C) BP: 127/77 Pulse Rate: 77 SpO2: 97 %   Anthropometric Measurements Height: 5\' 3"  (1.6 m) Weight: 246 lb (111.6 kg) BMI (Calculated): 43.59 Weight at Last Visit: 253 lb Weight Lost Since Last Visit: 7 lb Weight Gained Since Last  Visit: 0 Starting Weight: 260 lb Total Weight Loss (lbs): 14 lb (6.35 kg) Peak Weight: 260 lb   Body Composition  Body Fat %: 51.3 % Fat Mass (lbs): 126.2 lbs Muscle Mass (lbs): 113.8 lbs Total Body Water (lbs): 89.2 lbs Visceral Fat Rating : 17   Other Clinical Data Fasting: no Labs: no Today's Visit #: 3 Starting Date: 05/17/23     ASSESSMENT AND PLAN:  Diet: Susan Eaton is currently in the action stage of change. As such, her goal is to continue with weight loss efforts. She has agreed to keeping a food journal and adhering to recommended goals of 1250-1350 calories and 85 grams of protein.  Exercise: Susan Eaton has been instructed to work up to a goal of 150 minutes of combined cardio and strengthening exercise per week for weight loss and overall health benefits.   Behavior Modification:  We discussed the following Behavioral Modification Strategies today: increasing lean protein intake, decreasing simple carbohydrates, increasing vegetables,  increase H2O intake, increase high fiber foods, meal planning and cooking strategies, better snacking choices, avoiding temptations, planning for success, and keep a strict food journal. We discussed various medication options to help Susan Eaton with her weight loss efforts and we both agreed to continue current plan , continue to work on nutritional and behavioral strategies to promote weight loss.  .  Return in about 3 weeks (around 07/11/2023).Marland Kitchen She was informed of the importance of frequent follow up visits to maximize her success with intensive lifestyle modifications for her multiple health conditions.  Attestation Statements:   Reviewed by clinician on day of visit: allergies, medications, problem list, medical history, surgical history, family history, social history, and previous encounter notes.   Time spent on visit including pre-visit chart review and post-visit care and charting was 25 minutes.    Anissia Wessells, PA-C

## 2023-06-20 ENCOUNTER — Encounter (INDEPENDENT_AMBULATORY_CARE_PROVIDER_SITE_OTHER): Payer: Self-pay | Admitting: Physician Assistant

## 2023-06-20 ENCOUNTER — Ambulatory Visit (INDEPENDENT_AMBULATORY_CARE_PROVIDER_SITE_OTHER): Admitting: Physician Assistant

## 2023-06-20 VITALS — BP 127/77 | HR 77 | Temp 98.5°F | Ht 63.0 in | Wt 246.0 lb

## 2023-06-20 DIAGNOSIS — R7303 Prediabetes: Secondary | ICD-10-CM | POA: Diagnosis not present

## 2023-06-20 DIAGNOSIS — E559 Vitamin D deficiency, unspecified: Secondary | ICD-10-CM | POA: Diagnosis not present

## 2023-06-20 DIAGNOSIS — R7989 Other specified abnormal findings of blood chemistry: Secondary | ICD-10-CM

## 2023-06-20 DIAGNOSIS — F5089 Other specified eating disorder: Secondary | ICD-10-CM | POA: Diagnosis not present

## 2023-06-20 DIAGNOSIS — Z6841 Body Mass Index (BMI) 40.0 and over, adult: Secondary | ICD-10-CM

## 2023-06-26 ENCOUNTER — Telehealth (INDEPENDENT_AMBULATORY_CARE_PROVIDER_SITE_OTHER): Admitting: Psychology

## 2023-06-28 ENCOUNTER — Other Ambulatory Visit: Payer: Self-pay | Admitting: Nurse Practitioner

## 2023-06-28 DIAGNOSIS — J0120 Acute ethmoidal sinusitis, unspecified: Secondary | ICD-10-CM

## 2023-07-04 ENCOUNTER — Ambulatory Visit (INDEPENDENT_AMBULATORY_CARE_PROVIDER_SITE_OTHER): Admitting: Family Medicine

## 2023-07-04 ENCOUNTER — Encounter (INDEPENDENT_AMBULATORY_CARE_PROVIDER_SITE_OTHER): Payer: Self-pay | Admitting: Family Medicine

## 2023-07-04 VITALS — BP 130/85 | HR 73 | Temp 97.9°F | Ht 63.0 in | Wt 241.0 lb

## 2023-07-04 DIAGNOSIS — E669 Obesity, unspecified: Secondary | ICD-10-CM

## 2023-07-04 DIAGNOSIS — F5089 Other specified eating disorder: Secondary | ICD-10-CM | POA: Diagnosis not present

## 2023-07-04 DIAGNOSIS — E559 Vitamin D deficiency, unspecified: Secondary | ICD-10-CM | POA: Diagnosis not present

## 2023-07-04 DIAGNOSIS — R7303 Prediabetes: Secondary | ICD-10-CM | POA: Diagnosis not present

## 2023-07-04 DIAGNOSIS — Z6841 Body Mass Index (BMI) 40.0 and over, adult: Secondary | ICD-10-CM

## 2023-07-04 NOTE — Progress Notes (Signed)
 Susan Eaton, D.O.  ABFM, ABOM Specializing in Clinical Bariatric Medicine  Office located at: [address] [place], [place]  [address]   Assessment and Plan:  No orders of the defined types were placed in this encounter.  There are no discontinued medications.   No orders of the defined types were placed in this encounter.    FOR THE DISEASE OF OBESITY:  BMI 40.0-44.9, adult (HCC) Current BMI 42.7 Obesity (HCC)- Start BMI 46.0 DATE-05/17/23 Assessment & Plan: Since last office visit on 06/20/2023, patient's muscle mass has decreased by 2.8 lbs. Fat mass has decreased by 1.4 lbs. Total body water has increased by 3.2 lbs.  Counseling done on how various foods will affect these numbers and how to maximize success  Total lbs lost to date: 19 lbs Total weight loss percentage to date: 7.31%    Recommended Dietary Goals [name] is currently in the action stage of change. As such, her goal is to continue weight management plan.  She has agreed to: continue current plan   Behavioral Intervention We discussed the following today: Personalized instruction on tracking protein using Lose It app, Complex vs. Simple carbs, Edamame spaghetti, [employer] Marinara, using GPT or another AI platform for recipe ideas- searching "low calorie, low carb, high protein chicken recipes" etc  Additional resources provided today: Handout on mindful eating, Handout on non-starchy vegetables, Handout on balanced plate concepts, Handout on Examples of Low Glycemic Index and Low Calorie Fruits & Vegetables, and Handout on Food Journaling Log  Evidence-based interventions for health behavior change were utilized today including the discussion of self monitoring techniques, problem-solving barriers and SMART goal setting techniques.   Regarding patient's less desirable eating habits and patterns, we employed the technique of small changes.   Pt will specifically work on: Journaling intake  on paper log for next visit.    Recommended Physical Activity Goals Shanay has been advised to work up to 300-450 minutes of moderate intensity aerobic activity a week and strengthening exercises 2-3 times per week for cardiovascular health, weight loss maintenance and preservation of muscle mass.   She has agreed to: Walk 15 mins 5 days per week   Pharmacotherapy We both agreed to : continue with nutritional and behavioral strategies   FOR ASSOCIATED CONDITIONS ADDRESSED TODAY:  Prediabetes Assessment & Plan: [name] is not taking any PreDM medications. Diet/exercise approach. Hunger/cravings well controlled. Pt feels content with foods, not getting bored with meals. Anticipatory guidance given. [name] will continue to work on weight loss, exercise, via their meal plan we devised to help decrease the risk of progressing to diabetes. Will monitor condition closely as it pertains to her weight loss journey.   Other disorder of eating - Emotional eating Assessment & Plan: [name] is not taking any mood medications. Mood stable, pt denies any SI/HI. She originally agreed to meet with Dr. Dewaine Conger, but she canceled her appointment d/t not feeling like she needs that anymore. [name] endorses increasing mindfulness with eating, as well as improving with emotional eating. Reminded patient of the importance of following their prudent nutrition plan and how food can affect mood as well to support emotional wellbeing. Continue practicing mindfulness and increasing adherence to MP. Will continue to monitor.   Vitamin D deficiency Assessment & Plan: [name] is taking OTC Vit D 6000 units daily. Was initially suggested to take 8000 units, but pt expresses concerns about over supplementation. Informed [name] it is acceptable to take 6000 units, as long as she  is consistent with her regimen. Reminded pt of benefits of optimal Vit D levels, including improved energy and mood levels. Continue current supplementation regimen. Will  continue to monitor levels regularly.   Follow up:   Return in about 26 days (around 07/30/2023). She was informed of the importance of frequent follow up visits to maximize her success with intensive lifestyle modifications for her multiple health conditions.  Subjective:   Chief complaint: Obesity Susan Eaton is here to discuss her progress with her obesity treatment plan. She is journaling 1250-1350 cal and 85g of protein daily using the Category 1 Plan and states she is following her eating plan approximately 60% of the time. She states she is not exercising.  Interval History:  Susan Eaton is here for a follow up office visit. Since last OV on 06/20/2023, pt is down 5 lbs. She is adhering to MP, but struggling to increase water intake. [name] endorses tracking her food intake using Lose It app. Additionally, whenever she wants something sweet, [name] will eat toast with jelly or a 100 calorie snack.   Pharmacotherapy for weight loss: She is currently taking no anti-obesity medication.   Review of Systems:  Pertinent positives were addressed with patient today.  Reviewed by clinician on day of visit: allergies, medications, problem list, medical history, surgical history, family history, social history, and previous encounter notes.  Weight Summary and Biometrics   Weight Lost Since Last Visit: 5 lb  No data recorded   Vitals Temp: 97.9 F (36.6 C) BP: 130/85 Pulse Rate: 73 SpO2: 99 %   Anthropometric Measurements Height: 5\' 3"  (1.6 m) Weight: 241 lb (109.3 kg) BMI (Calculated): 42.7 Weight at Last Visit: 246 lb Weight Lost Since Last Visit: 5 lb Starting Weight: 260 lb Total Weight Loss (lbs): 19 lb (8.618 kg) Peak Weight: 260 b   Body Composition  Body Fat %: 51.6 % Fat Mass (lbs): 124.8 lbs Muscle Mass (lbs): 111 lbs Total Body Water (lbs): 92.4 lbs Visceral Fat Rating : 17   Other Clinical Data Today's Visit #: 4 Starting Date: 05/17/23    Objective:    PHYSICAL EXAM: Blood pressure 130/85, pulse 73, temperature 97.9 F (36.6 C), height 5\' 3"  (1.6 m), weight 241 lb (109.3 kg), SpO2 99%. Body mass index is 42.69 kg/m.  General: she is overweight, cooperative and in no acute distress. PSYCH: Has normal mood, affect and thought process.   HEENT: EOMI, sclerae are anicteric. Lungs: Normal breathing effort, no conversational dyspnea. Extremities: Moves * 4 Neurologic: A and O * 3, good insight  DIAGNOSTIC DATA REVIEWED: BMET    Component Value Date/Time   NA 141 05/17/2023 1101   K 4.5 05/17/2023 1101   CL 104 05/17/2023 1101   CO2 24 05/17/2023 1101   GLUCOSE 106 (H) 05/17/2023 1101   GLUCOSE 113 (H) 01/31/2023 0834   BUN 12 05/17/2023 1101   CREATININE 0.75 05/17/2023 1101   CREATININE 0.84 11/05/2019 1439   CALCIUM 9.5 05/17/2023 1101   Lab Results  Component Value Date   HGBA1C 6.1 (H) 05/17/2023   HGBA1C 5.9 12/25/2018   Lab Results  Component Value Date   INSULIN 11.8 05/17/2023   Lab Results  Component Value Date   TSH 2.590 05/17/2023   CBC    Component Value Date/Time   WBC 5.7 05/17/2023 1101   WBC 4.6 01/31/2023 0834   RBC 4.54 05/17/2023 1101   RBC 4.67 01/31/2023 0834   HGB 13.9 05/17/2023 1101   HCT 41.7  05/17/2023 1101   PLT 228 05/17/2023 1101   MCV 92 05/17/2023 1101   MCH 30.6 05/17/2023 1101   MCH 30.4 11/05/2019 1439   MCHC 33.3 05/17/2023 1101   MCHC 33.5 01/31/2023 0834   RDW 13.1 05/17/2023 1101   Iron Studies No results found for: "IRON", "TIBC", "FERRITIN", "IRONPCTSAT" Lipid Panel     Component Value Date/Time   CHOL 203 (H) 05/17/2023 1101   TRIG 110 05/17/2023 1101   HDL 65 05/17/2023 1101   CHOLHDL 3.1 05/17/2023 1101   CHOLHDL 4 01/31/2023 0834   VLDL 24.0 01/31/2023 0834   LDLCALC 119 (H) 05/17/2023 1101   LDLCALC 117 (H) 11/05/2019 1439   Hepatic Function Panel     Component Value Date/Time   PROT 6.7 05/17/2023 1101   ALBUMIN 4.3 05/17/2023 1101   AST 11  05/17/2023 1101   ALT 15 05/17/2023 1101   ALKPHOS 62 05/17/2023 1101   BILITOT 0.6 05/17/2023 1101      Component Value Date/Time   TSH 2.590 05/17/2023 1101   Nutritional Lab Results  Component Value Date   VD25OH 43.9 05/17/2023   VD25OH 34.02 01/31/2023   VD25OH 30.64 01/26/2021    Attestations:   I, Camryn Mix, acting as a Stage manager for [employer], DO., have compiled all relevant documentation for today's office visit on behalf of Marceil Sensor, DO, while in the presence of [employer], DO.  Reviewed by clinician on day of visit: allergies, medications, problem list, medical history, surgical history, family history, social history, and previous encounter notes pertinent to patient's obesity diagnosis. I have spent 46 minutes in the care of the patient today specifically: 4 minutes spent on pre/post charting activities, 42 minutes spent in face to face counseling with patient on how to read nutrition labels, discussing what a complex carb is, discussing healthy food options such as pasta, reviewing how to journal using Lose It app and personalized instruction on how to use app, and meal prep and planning, in addition to below counseling: preparing to see patient (e.g. review and interpretation of tests, old notes ), obtaining and/or reviewing separately obtained history, performing a medically appropriate examination or evaluation, counseling and educating the patient, ordering medications, test or procedures, documenting clinical information in the electronic or other health care record, and independently interpreting results and communicating results to the patient, family, or caregiver   I have reviewed the above documentation for accuracy and completeness, and I agree with the above. Rae Bugler, D.O.  The 21st Century Cures Act was signed into law in 2016 which includes the topic of electronic health records.  This provides immediate access to information in  MyChart.  This includes consultation notes, operative notes, office notes, lab results and pathology reports.  If you have any questions about what you read please let us  know at your next visit so we can discuss your concerns and take corrective action if need be.  We are right here with you.

## 2023-07-30 ENCOUNTER — Ambulatory Visit (INDEPENDENT_AMBULATORY_CARE_PROVIDER_SITE_OTHER): Admitting: Family Medicine

## 2023-07-30 ENCOUNTER — Encounter (INDEPENDENT_AMBULATORY_CARE_PROVIDER_SITE_OTHER): Payer: Self-pay | Admitting: Family Medicine

## 2023-07-30 VITALS — BP 122/84 | HR 66 | Temp 97.9°F | Ht 63.0 in | Wt 238.0 lb

## 2023-07-30 DIAGNOSIS — R7303 Prediabetes: Secondary | ICD-10-CM | POA: Diagnosis not present

## 2023-07-30 DIAGNOSIS — E669 Obesity, unspecified: Secondary | ICD-10-CM | POA: Diagnosis not present

## 2023-07-30 DIAGNOSIS — E7849 Other hyperlipidemia: Secondary | ICD-10-CM | POA: Diagnosis not present

## 2023-07-30 DIAGNOSIS — E559 Vitamin D deficiency, unspecified: Secondary | ICD-10-CM | POA: Diagnosis not present

## 2023-07-30 DIAGNOSIS — Z6841 Body Mass Index (BMI) 40.0 and over, adult: Secondary | ICD-10-CM

## 2023-07-30 NOTE — Progress Notes (Signed)
 Rae Bugler, D.O.  ABFM, ABOM Specializing in Clinical Bariatric Medicine  Office located at: [address] [place], [place]  [address]   Assessment and Plan:  No orders of the defined types were placed in this encounter.   There are no discontinued medications.   No orders of the defined types were placed in this encounter.   FOR THE DISEASE OF OBESITY:  BMI 40.0-44.9, adult (HCC) Current BMI 42.17 Obesity (HCC)- Start BMI 46.0 DATE-05/17/23 Assessment & Plan: Since last office visit on 07/04/2023 patient's  Muscle mass has decreased by 1 lb. Fat mass has decreased by 2.4 lb. Total body water has decreased by 1 lb.  Counseling done on how various foods will affect these numbers and how to maximize success  Total lbs lost to date: 22 lbs  Total weight loss percentage to date: 8.46%    Recommended Dietary Goals Zeila is currently in the action stage of change. As such, her goal is to continue weight management plan.  She has agreed to: continue current plan   Behavioral Intervention We discussed the following today: high protein breakfast options,  focusing on food with a 10:1 ratio of calories: grams of protein  Additional resources provided today: Handout on NEAT, Handout on CAT 1 meal plan , Handout on CAT 1-2 breakfast options, and Handout on CAT 1-2 lunch options  Evidence-based interventions for health behavior change were utilized today including the discussion of self monitoring techniques, problem-solving barriers and SMART goal setting techniques.   Regarding patient's less desirable eating habits and patterns, we employed the technique of small changes.   Pt will specifically work on: n/a   Recommended Physical Activity Goals Alverda has been advised to work up to 300-450 minutes of moderate intensity aerobic activity a week and strengthening exercises 2-3 times per week for cardiovascular health, weight loss maintenance and preservation of muscle  mass.   She has agreed to walk 15 minutes daily.   Pharmacotherapy We both agreed to : continue with nutritional and behavioral strategies   ASSOCIATED CONDITIONS ADDRESSED TODAY:  Prediabetes Assessment & Plan: Most recent A1c and fasting insulin : Lab Results  Component Value Date   HGBA1C 6.1 (H) 05/17/2023   HGBA1C 5.9 02/01/2023   HGBA1C 5.8 (H) 11/05/2019   INSULIN  11.8 05/17/2023    Diet/lifestyle approach. Her hunger and cravings are controlled when following her prudent nutritional plan. Continue working on nutrition plan to decrease simple carbohydrates, increase lean proteins and exercise to promote weight loss and improve glycemic control and prevent progression to T2DM.   Other hyperlipidemia Assessment & Plan: Most recent lipid panel: Lab Results  Component Value Date   CHOL 203 (H) 05/17/2023   HDL 65 05/17/2023   LDLCALC 119 (H) 05/17/2023   TRIG 110 05/17/2023   CHOLHDL 3.1 05/17/2023   Diet/lifestyle approach. LDL is not at goal. Elevated LDL may be secondary to nutrition, genetics and spillover effect from excess adiposity. Limit red meat intake to no more than two days a week. Continue to maintain a diet low in saturated and trans fats.    Vitamin D  deficiency Assessment & Plan: Most recent VD: Lab Results  Component Value Date   VD25OH 43.9 05/17/2023   VD25OH 34.02 01/31/2023   VD25OH 30.64 01/26/2021   Pt is doing well on OTC Vitamin D  6000 units daily. Continue regimen. Recheck periodically.   Follow up:   Return 08/21/2023 at 4:00 PM with [place], Evangelina Hilt, PA-C. She was informed of  the importance of frequent follow up visits to maximize her success with intensive lifestyle modifications for her multiple health conditions.  Subjective:   Chief complaint: Obesity Maudella is here to discuss her progress with her obesity treatment plan. She is journaling 1250-1350 cal and 85g of protein daily using the Category 1 Plan with B and L  options as a guide and states she is following her eating plan approximately 50% of the time. She states she is not exercising.  Interval History:  PACIENCE LAVERE is here for a follow up office visit. Since last OV on 07/04/2023, Mrs.Mussa is down 3 lbs. Went to [place]  recently and endorses eating some fatty foods there.    Overall states that she is making healthier choices.   Pharmacotherapy for weight loss: n/a  Review of Systems:  Pertinent positives were addressed with patient today. Reviewed by clinician on day of visit: allergies, medications, problem list, medical history, surgical history, family history, social history, and previous encounter notes.  Weight Summary and Biometrics   Weight Lost Since Last Visit: 3lb  Weight Gained Since Last Visit: 0   Vitals Temp: 97.9 F (36.6 C) BP: 122/84 Pulse Rate: 66 SpO2: 99 %   Anthropometric Measurements Height: 5\' 3"  (1.6 m) Weight: 238 lb (108 kg) BMI (Calculated): 42.17 Weight at Last Visit: 241lb Weight Lost Since Last Visit: 3lb Weight Gained Since Last Visit: 0 Starting Weight: 260lb Total Weight Loss (lbs): 22 lb (9.979 kg) Peak Weight: 260lb   Body Composition  Body Fat %: 51.4 % Fat Mass (lbs): 122.4 lbs Muscle Mass (lbs): 110 lbs Total Body Water (lbs): 91.4 lbs Visceral Fat Rating : 17   Other Clinical Data Fasting: no Labs: no Today's Visit #: 5 Starting Date: 05/17/23    Objective:   PHYSICAL EXAM: Blood pressure 122/84, pulse 66, temperature 97.9 F (36.6 C), height 5\' 3"  (1.6 m), weight 238 lb (108 kg), SpO2 99%. Body mass index is 42.16 kg/m.  General: she is overweight, cooperative and in no acute distress. PSYCH: Has normal mood, affect and thought process.   HEENT: EOMI, sclerae are anicteric. Lungs: Normal breathing effort, no conversational dyspnea. Extremities: Moves * 4 Neurologic: A and O * 3, good insight  DIAGNOSTIC DATA REVIEWED: BMET    Component Value  Date/Time   NA 141 05/17/2023 1101   K 4.5 05/17/2023 1101   CL 104 05/17/2023 1101   CO2 24 05/17/2023 1101   GLUCOSE 106 (H) 05/17/2023 1101   GLUCOSE 113 (H) 01/31/2023 0834   BUN 12 05/17/2023 1101   CREATININE 0.75 05/17/2023 1101   CREATININE 0.84 11/05/2019 1439   CALCIUM 9.5 05/17/2023 1101   Lab Results  Component Value Date   HGBA1C 6.1 (H) 05/17/2023   HGBA1C 5.9 12/25/2018   Lab Results  Component Value Date   INSULIN  11.8 05/17/2023   Lab Results  Component Value Date   TSH 2.590 05/17/2023   CBC    Component Value Date/Time   WBC 5.7 05/17/2023 1101   WBC 4.6 01/31/2023 0834   RBC 4.54 05/17/2023 1101   RBC 4.67 01/31/2023 0834   HGB 13.9 05/17/2023 1101   HCT 41.7 05/17/2023 1101   PLT 228 05/17/2023 1101   MCV 92 05/17/2023 1101   MCH 30.6 05/17/2023 1101   MCH 30.4 11/05/2019 1439   MCHC 33.3 05/17/2023 1101   MCHC 33.5 01/31/2023 0834   RDW 13.1 05/17/2023 1101   Iron Studies No results found for: "IRON", "TIBC", "  FERRITIN", "IRONPCTSAT" Lipid Panel     Component Value Date/Time   CHOL 203 (H) 05/17/2023 1101   TRIG 110 05/17/2023 1101   HDL 65 05/17/2023 1101   CHOLHDL 3.1 05/17/2023 1101   CHOLHDL 4 01/31/2023 0834   VLDL 24.0 01/31/2023 0834   LDLCALC 119 (H) 05/17/2023 1101   LDLCALC 117 (H) 11/05/2019 1439   Hepatic Function Panel     Component Value Date/Time   PROT 6.7 05/17/2023 1101   ALBUMIN 4.3 05/17/2023 1101   AST 11 05/17/2023 1101   ALT 15 05/17/2023 1101   ALKPHOS 62 05/17/2023 1101   BILITOT 0.6 05/17/2023 1101      Component Value Date/Time   TSH 2.590 05/17/2023 1101   Nutritional Lab Results  Component Value Date   VD25OH 43.9 05/17/2023   VD25OH 34.02 01/31/2023   VD25OH 30.64 01/26/2021    Attestations:   I, Special Puri , acting as a Stage manager for [employer], DO., have compiled all relevant documentation for today's office visit on behalf of Marceil Sensor, DO, while in the presence of  [employer], DO.  I have spent X minutes in the care of the patient today including: preparing to see patient (e.g. review and interpretation of tests, old notes ), obtaining and/or reviewing separately obtained history, performing a medically appropriate examination or evaluation, counseling and educating the patient, ordering medications, test or procedures, documenting clinical information in the electronic or other health care record, and independently interpreting results and communicating results to the patient, family, or caregiver   I have reviewed the above documentation for accuracy and completeness, and I agree with the above. Rae Bugler, D.O.  The 21st Century Cures Act was signed into law in 2016 which includes the topic of electronic health records.  This provides immediate access to information in MyChart.  This includes consultation notes, operative notes, office notes, lab results and pathology reports.  If you have any questions about what you read please let us  know at your next visit so we can discuss your concerns and take corrective action if need be.  We are right here with you.

## 2023-08-21 ENCOUNTER — Ambulatory Visit (INDEPENDENT_AMBULATORY_CARE_PROVIDER_SITE_OTHER): Admitting: Physician Assistant

## 2023-09-19 ENCOUNTER — Ambulatory Visit (INDEPENDENT_AMBULATORY_CARE_PROVIDER_SITE_OTHER): Admitting: Family Medicine

## 2023-09-19 ENCOUNTER — Encounter (INDEPENDENT_AMBULATORY_CARE_PROVIDER_SITE_OTHER): Payer: Self-pay | Admitting: Family Medicine

## 2023-09-19 VITALS — BP 136/85 | HR 80 | Temp 98.2°F | Ht 63.0 in | Wt 230.0 lb

## 2023-09-19 DIAGNOSIS — R7303 Prediabetes: Secondary | ICD-10-CM

## 2023-09-19 DIAGNOSIS — E65 Localized adiposity: Secondary | ICD-10-CM | POA: Insufficient documentation

## 2023-09-19 DIAGNOSIS — E559 Vitamin D deficiency, unspecified: Secondary | ICD-10-CM

## 2023-09-19 DIAGNOSIS — Z6841 Body Mass Index (BMI) 40.0 and over, adult: Secondary | ICD-10-CM

## 2023-09-19 DIAGNOSIS — E7849 Other hyperlipidemia: Secondary | ICD-10-CM | POA: Diagnosis not present

## 2023-09-19 DIAGNOSIS — E669 Obesity, unspecified: Secondary | ICD-10-CM

## 2023-09-19 MED ORDER — VITAMIN D 25 MCG (1000 UNIT) PO TABS: ORAL_TABLET | ORAL | Status: DC

## 2023-09-19 NOTE — Patient Instructions (Signed)
 The 10-year ASCVD risk score (Arnett DK, et al., 2019) is: 3%   Values used to calculate the score:     Age: 58 years     Clincally relevant sex: Female     Is Non-Hispanic African American: No     Diabetic: No     Tobacco smoker: No     Systolic Blood Pressure: 136 mmHg     Is BP treated: No     HDL Cholesterol: 65 mg/dL     Total Cholesterol: 203 mg/dL

## 2023-10-16 NOTE — Progress Notes (Signed)
 Susan Eaton, D.O.  ABFM, ABOM Specializing in Clinical Bariatric Medicine  Office located at: [address] [place], [place]  [address]   Assessment and Plan:   Medications Discontinued During This Encounter  Medication Reason   cholecalciferol (VITAMIN D3) 25 MCG (1000 UNIT) tablet Reorder    Meds ordered this encounter  Medications   cholecalciferol (VITAMIN D3) 25 MCG (1000 UNIT) tablet    Sig: 6,000 international unit M-F     Will obtain fasting labs at next OV -- FLP, A1c, fasting insulin , folate, Vit B12, and Vit D   FOR THE DISEASE OF OBESITY: Obesity (HCC)- Start BMI 46.0 DATE-05/17/23 BMI 40.0-44.9, adult (HCC) Current BMI 40.74 Assessment & Plan: Since last office visit on 07/30/23 patient's muscle mass has decreased by 1 lb. Fat mass has decreased by 6.4 lbs. Total body water not obtained today. Body fat % has decreased by 1.1%. Counseling done on how various foods will affect these numbers and how to maximize success  Total lbs lost to date: 30 lbs Total weight loss percentage to date: 11.54%   Recommended Dietary Goals Susan Eaton is currently in the action stage of change. As such, her goal is to continue weight management plan.  She has agreed to: continue current plan - encouraged her to start journaling if she desires.    Behavioral Intervention We discussed the following today: increasing lean protein intake to established goals, decreasing simple carbohydrates , reading food labels , keeping healthy foods at home, decreasing eating out or consumption of processed foods, and making healthy choices when eating convenient foods, and focusing on food with a 10:1 ratio of calories: grams of protein, reviewed lean protein sources.   Additional resources provided today: None  Evidence-based interventions for health behavior change were utilized today including the discussion of self monitoring techniques, problem-solving barriers and SMART goal setting  techniques.   Regarding patient's less desirable eating habits and patterns, we employed the technique of small changes.   Pt will specifically work on: increase mindfulness of calories:protein on nutritional labels.     Recommended Physical Activity Goals Susan Eaton has been advised to work up to 300-450 minutes of moderate intensity aerobic activity a week and strengthening exercises 2-3 times per week for cardiovascular health, weight loss maintenance and preservation of muscle mass.   She has agreed to: Increase physical activity in their day and reduce sedentary time (increase NEAT).   Pharmacotherapy We both agreed to: Continue with current nutritional and behavioral strategies   ASSOCIATED CONDITIONS ADDRESSED TODAY:  Prediabetes Assessment & Plan: Lab Results  Component Value Date   HGBA1C 6.1 (H) 05/17/2023   HGBA1C 5.9 02/01/2023   HGBA1C 5.8 (H) 11/05/2019   INSULIN  11.8 05/17/2023    No meds currently. Diet/lifestyle approach. Pt working on lean protein intake, but still eating off-plan foods while on vacation.   Get back on track with following her nutritional meal plan and consider journaling. Decrease simple carb/sugar intake and increase lean protein intake. Will recheck A1c and fasting insulin  at next OV.     Other hyperlipidemia Assessment & Plan: Lab Results  Component Value Date   CHOL 203 (H) 05/17/2023   HDL 65 05/17/2023   LDLCALC 119 (H) 05/17/2023   TRIG 110 05/17/2023   CHOLHDL 3.1 05/17/2023   The 10-year ASCVD risk score (Arnett DK, et al., 2019) is: 3%   Values used to calculate the score:     Age: 58 years     Clincally  relevant sex: Female     Is Non-Hispanic African American: No     Diabetic: No     Tobacco smoker: No     Systolic Blood Pressure: 136 mmHg     Is BP treated: No     HDL Cholesterol: 65 mg/dL     Total Cholesterol: 203 mg/dL  No meds currently. Diet/lifestyle approach. Reviewed latest labs from 04/2023; LDL was above goal  at 119. Pt's ASCVD risk is 3%, not consistent with needing medication.   Educated pt on when medication would be recommended according to ASCVD scores; low dose if >5 and medium-high dose if <7.5. Reduce consumption of fatty meats and saturated/trans fats. Pt not fasting today; will recheck FLP at her next OV.    Vitamin D  deficiency Assessment & Plan: Lab Results  Component Value Date   VD25OH 43.9 05/17/2023   VD25OH 34.02 01/31/2023   VD25OH 30.64 01/26/2021   Currently taking OTC vit D 6,000 units M-F. Pt tripled her dose; was previously taking 2,000 units M-F. Tolerating well, no SE.   Continue current supplementation. Will recheck her vit D at her next OV.     Visceral obesity Obesity (HCC)- Start BMI 46.0 DATE-05/17/23 BMI 40.0-44.9, adult (HCC) Current BMI 40.74 Assessment & Plan: Since starting the program, her visceral fat rating has improved from 19 on 05/17/23 to 16 today.   Ideal visceral fat rating for women is less than 10. Discussed the pathophysiology of MASLD/ fatty liver and the progression of fibrosis to cirrhosis if obesity is not treated. Increase water intake. Avoid NSAIDs. Continue efforts with diet/exercise/lifestyle interventions for weight loss and obesity treatment.     Follow up:   Return in about 4 weeks (around 10/17/2023) for fasting for labs;   (ok to make 2nd appt). She was informed of the importance of frequent follow up visits to maximize her success with intensive lifestyle modifications for her multiple health conditions.  Subjective:   Chief complaint: Obesity Susan Eaton is here to discuss her progress with her obesity treatment plan. She is journaling 1250-1350 cal and 85g of protein daily using the Category 1 Plan with B and L options as a guide and states she is following her eating plan approximately 25% of the time. She states she is not exercising.   Interval History:  Susan Eaton is here for a follow up office visit. Since last OV  on 07/30/23, she is down 8 lbs. Although she has been trying to prioritize her protein intake, she feels she is still not meeting her daily recommended amount. Has been using marinating her meats with greek yogurt and horseradish. She recently went on separate vacations; to [facility] and to visit her hometown in [place]. She admits to eating a bacon egg and cheese bagel. Did not eat [place] food or pizza.   Pharmacotherapy that aid with weight loss: She is currently taking no anti-obesity medication.    Review of Systems:  Pertinent positives were addressed with patient today.  Reviewed by clinician on day of visit: allergies, medications, problem list, medical history, surgical history, family history, social history, and previous encounter notes.  Weight Summary and Biometrics   *See synopsis for biometric data.*  Objective:   PHYSICAL EXAM: Blood pressure 136/85, pulse 80, temperature 98.2 F (36.8 C), height 5' 3 (1.6 m), weight 230 lb (104.3 kg), SpO2 100%. Body mass index is 40.74 kg/m.  General: she is overweight, cooperative and in no acute distress. PSYCH: Has normal mood, affect  and thought process.   HEENT: EOMI, sclerae are anicteric. Lungs: Normal breathing effort, no conversational dyspnea. Extremities: Moves * 4 Neurologic: A and O * 3, good insight  DIAGNOSTIC DATA REVIEWED: BMET    Component Value Date/Time   NA 141 05/17/2023 1101   K 4.5 05/17/2023 1101   CL 104 05/17/2023 1101   CO2 24 05/17/2023 1101   GLUCOSE 106 (H) 05/17/2023 1101   GLUCOSE 113 (H) 01/31/2023 0834   BUN 12 05/17/2023 1101   CREATININE 0.75 05/17/2023 1101   CREATININE 0.84 11/05/2019 1439   CALCIUM 9.5 05/17/2023 1101   Lab Results  Component Value Date   HGBA1C 6.1 (H) 05/17/2023   HGBA1C 5.9 12/25/2018   Lab Results  Component Value Date   INSULIN  11.8 05/17/2023   Lab Results  Component Value Date   TSH 2.590 05/17/2023   CBC    Component Value Date/Time   WBC 5.7  05/17/2023 1101   WBC 4.6 01/31/2023 0834   RBC 4.54 05/17/2023 1101   RBC 4.67 01/31/2023 0834   HGB 13.9 05/17/2023 1101   HCT 41.7 05/17/2023 1101   PLT 228 05/17/2023 1101   MCV 92 05/17/2023 1101   MCH 30.6 05/17/2023 1101   MCH 30.4 11/05/2019 1439   MCHC 33.3 05/17/2023 1101   MCHC 33.5 01/31/2023 0834   RDW 13.1 05/17/2023 1101   Iron Studies No results found for: IRON, TIBC, FERRITIN, IRONPCTSAT Lipid Panel     Component Value Date/Time   CHOL 203 (H) 05/17/2023 1101   TRIG 110 05/17/2023 1101   HDL 65 05/17/2023 1101   CHOLHDL 3.1 05/17/2023 1101   CHOLHDL 4 01/31/2023 0834   VLDL 24.0 01/31/2023 0834   LDLCALC 119 (H) 05/17/2023 1101   LDLCALC 117 (H) 11/05/2019 1439   Hepatic Function Panel     Component Value Date/Time   PROT 6.7 05/17/2023 1101   ALBUMIN 4.3 05/17/2023 1101   AST 11 05/17/2023 1101   ALT 15 05/17/2023 1101   ALKPHOS 62 05/17/2023 1101   BILITOT 0.6 05/17/2023 1101      Component Value Date/Time   TSH 2.590 05/17/2023 1101   Nutritional Lab Results  Component Value Date   VD25OH 43.9 05/17/2023   VD25OH 34.02 01/31/2023   VD25OH 30.64 01/26/2021    Attestations:   I, Susan Eaton, acting as a medical scribe for Susan Jenkins, DO., have compiled all relevant documentation for today's office visit on behalf of Susan Jenkins, DO, while in the presence of [employer], DO.  I have reviewed the above documentation for accuracy and completeness, and I agree with the above. Susan Eaton, D.O.  The 21st Century Cures Act was signed into law in 2016 which includes the topic of electronic health records.  This provides immediate access to information in MyChart.  This includes consultation notes, operative notes, office notes, lab results and pathology reports.  If you have any questions about what you read please let us  know at your next visit so we can discuss your concerns and take corrective action if need be.  We  are right here with you.

## 2023-10-29 ENCOUNTER — Ambulatory Visit (INDEPENDENT_AMBULATORY_CARE_PROVIDER_SITE_OTHER): Admitting: Family Medicine

## 2023-10-29 ENCOUNTER — Encounter (INDEPENDENT_AMBULATORY_CARE_PROVIDER_SITE_OTHER): Payer: Self-pay | Admitting: Family Medicine

## 2023-10-29 VITALS — BP 134/83 | HR 66 | Temp 98.1°F | Ht 63.0 in | Wt 228.0 lb

## 2023-10-29 DIAGNOSIS — E7849 Other hyperlipidemia: Secondary | ICD-10-CM | POA: Diagnosis not present

## 2023-10-29 DIAGNOSIS — E559 Vitamin D deficiency, unspecified: Secondary | ICD-10-CM

## 2023-10-29 DIAGNOSIS — Z6841 Body Mass Index (BMI) 40.0 and over, adult: Secondary | ICD-10-CM

## 2023-10-29 DIAGNOSIS — R7303 Prediabetes: Secondary | ICD-10-CM

## 2023-10-29 DIAGNOSIS — E669 Obesity, unspecified: Secondary | ICD-10-CM

## 2023-10-29 NOTE — Progress Notes (Signed)
 Barnie DOROTHA Jenkins, D.O.  ABFM, ABOM Specializing in Clinical Bariatric Medicine  Office located at: [address] [place], [place]  [address]   Assessment and Plan:   Orders Placed This Encounter  Procedures   Insulin , random   Hemoglobin A1c   Lipid panel   VITAMIN D  25 Hydroxy (Vit-D Deficiency, Fractures)   Vitamin B12   Folate   Comprehensive metabolic panel with GFR    FOR THE DISEASE OF OBESITY:  BMI 40.0-44.9, adult (HCC) Current BMI 40.39 Obesity (HCC)- Start BMI 46.0 DATE-05/17/23 Assessment & Plan: Since last office visit on 09/19/2023 patient's muscle mass has decreased by 2.2 lbs. Fat mass has decreased by 0.4 lbs. Total body water is 86.6 lbs. Body fat % has increased by 0.4 %. Counseling done on how various foods will affect these numbers and how to maximize success  Total lbs lost to date: - 32 lbs Total weight loss percentage to date: -12.31 %   Recommended Dietary Goals Susan Eaton is currently in the action stage of change. As such, her goal is to continue weight management plan.  She has agreed to: continue current plan   Behavioral Intervention We discussed the following today: high protein snacking choices, increasing lean protein intake to established goals, work on tracking and journaling calories using tracking application, and using GPT or another AI platform for recipe ideas- searching low calorie, low carb, high protein chicken recipes etc  Additional resources provided today: None  Evidence-based interventions for health behavior change were utilized today including the discussion of self monitoring techniques, problem-solving barriers and SMART goal setting techniques.   Regarding patient's less desirable eating habits and patterns, we employed the technique of small changes.   Goal: journaling consistently   Recommended Physical Activity Goals Susan Eaton has been advised to work up to 300-450 minutes of moderate intensity aerobic activity a  week and strengthening exercises 2-3 times per week for cardiovascular health, weight loss maintenance and preservation of muscle mass.   Exercise Goal: walk 10 minutes daily    Pharmacotherapy Continue with current nutritional and behavioral strategies   ASSOCIATED CONDITIONS ADDRESSED TODAY:   Other hyperlipidemia Assessment & Plan: Lab Results  Component Value Date   CHOL 203 (H) 05/17/2023   HDL 65 05/17/2023   LDLCALC 119 (H) 05/17/2023   TRIG 110 05/17/2023   CHOLHDL 3.1 05/17/2023    HLD managed with dietary and life style interventions. Lipid panel dated 05/17/2023 demonstrated total cholesterol 203 and LDL 119.   - Continue to work on nutrition plan: decreasing simple carbohydrates, increasing lean proteins, decreasing saturated fats and cholesterol , avoiding trans fats and exercise as able to promote weight loss, improve lipids and decrease cardiovascular risks.  - Recheck FLP today.    Prediabetes Assessment & Plan: Lab Results  Component Value Date   HGBA1C 6.1 (H) 05/17/2023   HGBA1C 5.9 02/01/2023   HGBA1C 5.8 (H) 11/05/2019   INSULIN  11.8 05/17/2023    Pre-DM managed with dietary and lifestyle interventions. She sometimes has cravings for carbohydrates likely secondary to being under in her protein intake.   - Pt will work on increasing protein intake for craving suppression.  - Consider Metformin in the future if needed.  - Continue working on nutrition plan/journaling intake.  - Recheck labs today.     Vitamin D  deficiency Assessment & Plan: Lab Results  Component Value Date   VD25OH 43.9 05/17/2023   VD25OH 34.02 01/31/2023   VD25OH 30.64 01/26/2021   Currently  on OTC Vit D 6,000 units Monday-Friday with good compliance and tolerance. No acute concerns.   - Continue regimen and weight loss efforts.  - Recheck levels today.    Follow up:   Return 11/26/2023 4:00 PM.  She was informed of the importance of frequent follow up visits to  maximize her success with intensive lifestyle modifications for her multiple health conditions.  Susan Eaton is aware that we will review all of her lab results at our next visit together in person.  She is aware that if anything is critical/ life threatening with the results, we will be contacting her via MyChart or by my CMA will be calling them prior to the office visit to discuss acute management.      Subjective:   Chief complaint: Obesity Susan Eaton is here to discuss her progress with her obesity treatment plan. She is journaling 1250-1350 cal and 85g of protein daily using the Category 1 Plan with B and L options as a guide and states she is following her eating plan approximately 50% or less of the time. She states she is not exercising.   Interval History:  Susan Eaton is here for a follow up office visit. Since last OV on 09/19/2023 , she is down 2 lbs. States she deviated from her meal plan during her birthday and during instances when she had company over. Overall, she struggles to get in her protein. She is ready to get back on track and desires to journal more.    Pharmacotherapy that aid with weight loss: n/a   Review of Systems:  Pertinent positives were addressed with patient today.  Reviewed by clinician on day of visit: allergies, medications, problem list, medical history, surgical history, family history, social history, and previous encounter notes.  Weight Summary and Biometrics   Weight Lost Since Last Visit: 2lb  Weight Gained Since Last Visit: 0  Vitals Temp: 98.1 F (36.7 C) BP: 134/83 Pulse Rate: 66 SpO2: 99 %   Anthropometric Measurements Height: 5' 3 (1.6 m) Weight: 228 lb (103.4 kg) BMI (Calculated): 40.4 Weight at Last Visit: 230lb Weight Lost Since Last Visit: 2lb Weight Gained Since Last Visit: 0 Starting Weight: 260lb Total Weight Loss (lbs): 32 lb (14.5 kg) Peak Weight: 260lb   Body Composition  Body Fat %: 50.7 % Fat Mass  (lbs): 115.6 lbs Muscle Mass (lbs): 106.8 lbs Total Body Water (lbs): 86.6 lbs Visceral Fat Rating : 16   Other Clinical Data Fasting: yes Labs: yes Today's Visit #: 7 Starting Date: 05/17/23    Objective:   PHYSICAL EXAM: Blood pressure 134/83, pulse 66, temperature 98.1 F (36.7 C), height 5' 3 (1.6 m), weight 228 lb (103.4 kg), SpO2 99%. Body mass index is 40.39 kg/m.  General: she is overweight, cooperative and in no acute distress. PSYCH: Has normal mood, affect and thought process.   HEENT: EOMI, sclerae are anicteric. Lungs: Normal breathing effort, no conversational dyspnea. Extremities: Moves * 4 Neurologic: A and O * 3, good insight  DIAGNOSTIC DATA REVIEWED: BMET    Component Value Date/Time   NA 141 05/17/2023 1101   K 4.5 05/17/2023 1101   CL 104 05/17/2023 1101   CO2 24 05/17/2023 1101   GLUCOSE 106 (H) 05/17/2023 1101   GLUCOSE 113 (H) 01/31/2023 0834   BUN 12 05/17/2023 1101   CREATININE 0.75 05/17/2023 1101   CREATININE 0.84 11/05/2019 1439   CALCIUM 9.5 05/17/2023 1101   Lab Results  Component Value Date  HGBA1C 6.1 (H) 05/17/2023   HGBA1C 5.9 12/25/2018   Lab Results  Component Value Date   INSULIN  11.8 05/17/2023   Lab Results  Component Value Date   TSH 2.590 05/17/2023   CBC    Component Value Date/Time   WBC 5.7 05/17/2023 1101   WBC 4.6 01/31/2023 0834   RBC 4.54 05/17/2023 1101   RBC 4.67 01/31/2023 0834   HGB 13.9 05/17/2023 1101   HCT 41.7 05/17/2023 1101   PLT 228 05/17/2023 1101   MCV 92 05/17/2023 1101   MCH 30.6 05/17/2023 1101   MCH 30.4 11/05/2019 1439   MCHC 33.3 05/17/2023 1101   MCHC 33.5 01/31/2023 0834   RDW 13.1 05/17/2023 1101   Iron Studies No results found for: IRON, TIBC, FERRITIN, IRONPCTSAT Lipid Panel     Component Value Date/Time   CHOL 203 (H) 05/17/2023 1101   TRIG 110 05/17/2023 1101   HDL 65 05/17/2023 1101   CHOLHDL 3.1 05/17/2023 1101   CHOLHDL 4 01/31/2023 0834   VLDL  24.0 01/31/2023 0834   LDLCALC 119 (H) 05/17/2023 1101   LDLCALC 117 (H) 11/05/2019 1439   Hepatic Function Panel     Component Value Date/Time   PROT 6.7 05/17/2023 1101   ALBUMIN 4.3 05/17/2023 1101   AST 11 05/17/2023 1101   ALT 15 05/17/2023 1101   ALKPHOS 62 05/17/2023 1101   BILITOT 0.6 05/17/2023 1101      Component Value Date/Time   TSH 2.590 05/17/2023 1101   Nutritional Lab Results  Component Value Date   VD25OH 43.9 05/17/2023   VD25OH 34.02 01/31/2023   VD25OH 30.64 01/26/2021    Attestations:   I, Special Puri, acting as a Stage manager for [employer], DO., have compiled all relevant documentation for today's office visit on behalf of Barnie Jenkins, DO, while in the presence of [employer], DO.  I have reviewed the above documentation for accuracy and completeness, and I agree with the above. Barnie JINNY Jenkins, D.O.  The 21st Century Cures Act was signed into law in 2016 which includes the topic of electronic health records.  This provides immediate access to information in MyChart.  This includes consultation notes, operative notes, office notes, lab results and pathology reports.  If you have any questions about what you read please let us  know at your next visit so we can discuss your concerns and take corrective action if need be.  We are right here with you.

## 2023-10-30 LAB — COMPREHENSIVE METABOLIC PANEL WITH GFR
ALT: 19 IU/L (ref 0–32)
AST: 15 IU/L (ref 0–40)
Albumin: 3.9 g/dL (ref 3.8–4.9)
Alkaline Phosphatase: 58 IU/L (ref 44–121)
BUN/Creatinine Ratio: 17 (ref 12–28)
BUN: 15 mg/dL (ref 8–27)
Bilirubin Total: 0.5 mg/dL (ref 0.0–1.2)
CO2: 20 mmol/L (ref 20–29)
Calcium: 9.4 mg/dL (ref 8.7–10.3)
Chloride: 106 mmol/L (ref 96–106)
Creatinine, Ser: 0.87 mg/dL (ref 0.57–1.00)
Globulin, Total: 2.5 g/dL (ref 1.5–4.5)
Glucose: 106 mg/dL — ABNORMAL HIGH (ref 70–99)
Potassium: 4.7 mmol/L (ref 3.5–5.2)
Sodium: 141 mmol/L (ref 134–144)
Total Protein: 6.4 g/dL (ref 6.0–8.5)
eGFR: 76 mL/min/1.73 (ref >59)

## 2023-10-30 LAB — INSULIN, RANDOM: INSULIN: 8.2 u[IU]/mL (ref 2.6–24.9)

## 2023-10-30 LAB — HEMOGLOBIN A1C
Est. average glucose Bld gHb Est-mCnc: 108 mg/dL
Hgb A1c MFr Bld: 5.4 % (ref 4.8–5.6)

## 2023-10-30 LAB — LIPID PANEL
Chol/HDL Ratio: 2.9 ratio (ref 0.0–4.4)
Cholesterol, Total: 189 mg/dL (ref 100–199)
HDL: 65 mg/dL (ref >39)
LDL Chol Calc (NIH): 112 mg/dL — ABNORMAL HIGH (ref 0–99)
Triglycerides: 63 mg/dL (ref 0–149)
VLDL Cholesterol Cal: 12 mg/dL (ref 5–40)

## 2023-10-30 LAB — FOLATE: Folate: >20 ng/mL (ref >3.0)

## 2023-10-30 LAB — VITAMIN B12: Vitamin B-12: 659 pg/mL (ref 232–1245)

## 2023-10-30 LAB — VITAMIN D 25 HYDROXY (VIT D DEFICIENCY, FRACTURES): Vit D, 25-Hydroxy: 66.5 ng/mL (ref 30.0–100.0)

## 2023-11-26 ENCOUNTER — Encounter (INDEPENDENT_AMBULATORY_CARE_PROVIDER_SITE_OTHER): Payer: Self-pay | Admitting: Family Medicine

## 2023-11-26 ENCOUNTER — Ambulatory Visit (INDEPENDENT_AMBULATORY_CARE_PROVIDER_SITE_OTHER): Admitting: Family Medicine

## 2023-11-26 VITALS — BP 138/96 | HR 61 | Temp 98.2°F | Ht 63.0 in | Wt 224.0 lb

## 2023-11-26 DIAGNOSIS — E7849 Other hyperlipidemia: Secondary | ICD-10-CM

## 2023-11-26 DIAGNOSIS — E559 Vitamin D deficiency, unspecified: Secondary | ICD-10-CM

## 2023-11-26 DIAGNOSIS — R7303 Prediabetes: Secondary | ICD-10-CM | POA: Diagnosis not present

## 2023-11-26 DIAGNOSIS — E66812 Obesity, class 2: Secondary | ICD-10-CM | POA: Diagnosis not present

## 2023-11-26 DIAGNOSIS — Z6841 Body Mass Index (BMI) 40.0 and over, adult: Secondary | ICD-10-CM

## 2023-11-26 MED ORDER — VITAMIN D 25 MCG (1000 UNIT) PO TABS: ORAL_TABLET | ORAL | Status: AC

## 2023-11-26 NOTE — Progress Notes (Signed)
 Susan Eaton, D.O.  ABFM, ABOM Specializing in Clinical Bariatric Medicine  Office located at: [address] [place], [place]  [address]   Assessment and Plan:   Medications Discontinued During This Encounter  Medication Reason   cholecalciferol (VITAMIN D3) 25 MCG (1000 UNIT) tablet Reorder    Meds ordered this encounter  Medications   cholecalciferol (VITAMIN D3) 25 MCG (1000 UNIT) tablet    Sig: 6,000 international unit M-F     FOR THE DISEASE OF OBESITY:  Class 2 obesity with body mass index (BMI) of 35 to 39.9 without comorbidity - current BMI 39.69; BMI 40.0-44.9, adult (HCC) Current BMI 40.39; Obesity (HCC)- Start BMI 46.0 DATE-05/17/23 Assessment & Plan: Since last office visit on 10/29/2023 patient's muscle mass has remained the same at 106.8 lbs. Fat mass has decreased by 4 lbs. Total body water has increased by 1.2 lbs.  Body fat % has decreased by 0.9%. Counseling done on how various foods will affect these numbers and how to maximize success  Total lbs lost to date: 36 lbs Total weight loss percentage to date: 13.65%   Recommended Dietary Goals Susan Eaton is currently in the action stage of change. As such, her goal is to continue weight management plan.  She has agreed to: continue current plan    Behavioral Intervention We discussed the following today: increasing water intake  to at least 100 oz daily, alternative options to white rice (e.g long-grain rice, brown rice, fairo, or buckwheat), high protein cereals, healthy fats  Additional resources provided today: Handout on Daily Food Journaling Log  Evidence-based interventions for health behavior change were utilized today including the discussion of self monitoring techniques, problem-solving barriers and SMART goal setting techniques.   Regarding patient's less desirable eating habits and patterns, we employed the technique of small changes.   Goal(s) for next OV: measure proteins at dinner,  journaling more regularly, and drink 100+ ounces of water daily.    Recommended Physical Activity Goals Susan Eaton has been advised to work up to 300-450 minutes of moderate intensity aerobic activity a week and strengthening exercises 2-3 times per week for cardiovascular health, weight loss maintenance and preservation of muscle mass.   She has agreed to: Think about enjoyable ways to increase daily physical activity and overcoming barriers to exercise   Pharmacotherapy We both agreed to: Continue with current nutritional and behavioral strategies   ASSOCIATED CONDITIONS ADDRESSED TODAY:  Other hyperlipidemia Assessment & Plan: Lipid-lowering medications: none. Reviewed lipid panel below today with patient. LDL has improved from 119 to 112 (optimal level <100). Other components of lipid panel WNL. No concerns with ASCVD risk score. Continue diet low in saturated and trans fats. Increase exercise as tolerable.  Lab Results  Component Value Date   CHOL 189 10/29/2023   HDL 65 10/29/2023   LDLCALC 112 (H) 10/29/2023   TRIG 63 10/29/2023   CHOLHDL 2.9 10/29/2023   The 10-year ASCVD risk score (Arnett DK, et al., 2019) is: 3.3%   Values used to calculate the score:     Age: 58 years     Clincally relevant sex: Female     Is Non-Hispanic African American: No     Diabetic: No     Tobacco smoker: No     Systolic Blood Pressure: 138 mmHg     Is BP treated: No     HDL Cholesterol: 65 mg/dL     Total Cholesterol: 189 mg/dL    Prediabetes Assessment & Plan: Reviewed  labs with pt. HgbA1c improved to 5.4 from 6.1 in 04/2023. Fasting insulin  improved from 11.8 to 8.2; ideal level <5. B12, folate, kideny function, electrolytes, and liver enzymes are WNL. Encouraged her to continue decreasing simple carbs and increasing proteins. Increase exercise as able. \ Lab Results  Component Value Date   HGBA1C 5.4 10/29/2023   HGBA1C 6.1 (H) 05/17/2023   HGBA1C 5.9 02/01/2023   INSULIN  8.2  10/29/2023   INSULIN  11.8 05/17/2023   Lab Results  Component Value Date   VITAMINB12 659 10/29/2023   FOLATE >20.0 10/29/2023   Lab Results  Component Value Date   NA 141 10/29/2023   K 4.7 10/29/2023   CO2 20 10/29/2023   GLUCOSE 106 (H) 10/29/2023   BUN 15 10/29/2023   CREATININE 0.87 10/29/2023   CALCIUM 9.4 10/29/2023   GFR 65.68 01/31/2023   EGFR 76 10/29/2023   Lab Results  Component Value Date   ALT 19 10/29/2023   AST 15 10/29/2023   ALKPHOS 58 10/29/2023   BILITOT 0.5 10/29/2023     Vitamin D  deficiency Assessment & Plan Managed with Vitamin-D 6000 units 5x/ week, which she endorses compliance with. Her Vit D level is at goal of 50 to 70. Cont supplementation; recheck in another 3 months. Lab Results  Component Value Date   VD25OH 66.5 10/29/2023     Follow up:   Return in about 31 days (around 12/27/2023). She was informed of the importance of frequent follow up visits to maximize her success with intensive lifestyle modifications for her multiple health conditions.   Subjective:    Chief complaint: Obesity Susan Eaton is here to discuss her progress with her obesity treatment plan. She is journaling 1250-1350 cal and 85g of protein daily using the Category 1 Plan with B and L options as a guide and states she is following her eating plan approximately 25% of the time. Pt is not exercising.   Interval History:  Susan Eaton is here for a follow up office visit. Pt has experienced a weight loss of 4 since last OV on 10/29/2023. She admits she has not been journaling/tracking her calories or staying adequately hydrated. Describes her breakfast generally consisting of yogurt, cottage cheese, egg bites, or [place] sausage. For lunch, she'll have salad with [place] or ham, or wrapped in a casedilla. Based of this, it seems like she is following her meal plan closer to 50%.   Pharmacotherapy that aid with weight loss: She is currently taking no anti-obesity  medication.    Review of Systems:  Pertinent positives were addressed with patient today.  Reviewed by clinician on day of visit: allergies, medications, problem list, medical history, surgical history, family history, social history, and previous encounter notes.  Weight Summary and Biometrics   Weight Lost Since Last Visit: 4 lb  Weight Gained Since Last Visit: 0    Vitals Temp: 98.2 F (36.8 C) BP: (!) 138/96 Pulse Rate: 61 SpO2: 98 %   Anthropometric Measurements Height: 5' 3 (1.6 m) Weight: 224 lb (101.6 kg) BMI (Calculated): 39.69 Weight at Last Visit: 228 lb Weight Lost Since Last Visit: 4 lb Weight Gained Since Last Visit: 0 Starting Weight: 260 lb Total Weight Loss (lbs): 36 lb (16.3 kg) Peak Weight: 260 lb   Body Composition  Body Fat %: 49.8 % Fat Mass (lbs): 111.6 lbs Muscle Mass (lbs): 106.8 lbs Total Body Water (lbs): 87.8 lbs Visceral Fat Rating : 16   Other Clinical Data Fasting: no Labs: no  Today's Visit #: 8 Starting Date: 05/17/23    Objective:   PHYSICAL EXAM: Blood pressure (!) 138/96, pulse 61, temperature 98.2 F (36.8 C), height 5' 3 (1.6 m), weight 224 lb (101.6 kg), SpO2 98%. Body mass index is 39.68 kg/m.  General: she is overweight, cooperative and in no acute distress. PSYCH: Has normal mood, affect and thought process.   HEENT: EOMI, sclerae are anicteric. Lungs: Normal breathing effort, no conversational dyspnea. Extremities: Moves * 4 Neurologic: A and O * 3, good insight  DIAGNOSTIC DATA REVIEWED: BMET    Component Value Date/Time   NA 141 10/29/2023 0809   K 4.7 10/29/2023 0809   CL 106 10/29/2023 0809   CO2 20 10/29/2023 0809   GLUCOSE 106 (H) 10/29/2023 0809   GLUCOSE 113 (H) 01/31/2023 0834   BUN 15 10/29/2023 0809   CREATININE 0.87 10/29/2023 0809   CREATININE 0.84 11/05/2019 1439   CALCIUM 9.4 10/29/2023 0809   Lab Results  Component Value Date   HGBA1C 5.4 10/29/2023   HGBA1C 5.9 12/25/2018    Lab Results  Component Value Date   INSULIN  8.2 10/29/2023   INSULIN  11.8 05/17/2023   Lab Results  Component Value Date   TSH 2.590 05/17/2023   CBC    Component Value Date/Time   WBC 5.7 05/17/2023 1101   WBC 4.6 01/31/2023 0834   RBC 4.54 05/17/2023 1101   RBC 4.67 01/31/2023 0834   HGB 13.9 05/17/2023 1101   HCT 41.7 05/17/2023 1101   PLT 228 05/17/2023 1101   MCV 92 05/17/2023 1101   MCH 30.6 05/17/2023 1101   MCH 30.4 11/05/2019 1439   MCHC 33.3 05/17/2023 1101   MCHC 33.5 01/31/2023 0834   RDW 13.1 05/17/2023 1101   Iron Studies No results found for: IRON, TIBC, FERRITIN, IRONPCTSAT Lipid Panel     Component Value Date/Time   CHOL 189 10/29/2023 0809   TRIG 63 10/29/2023 0809   HDL 65 10/29/2023 0809   CHOLHDL 2.9 10/29/2023 0809   CHOLHDL 4 01/31/2023 0834   VLDL 24.0 01/31/2023 0834   LDLCALC 112 (H) 10/29/2023 0809   LDLCALC 117 (H) 11/05/2019 1439   Hepatic Function Panel     Component Value Date/Time   PROT 6.4 10/29/2023 0809   ALBUMIN 3.9 10/29/2023 0809   AST 15 10/29/2023 0809   ALT 19 10/29/2023 0809   ALKPHOS 58 10/29/2023 0809   BILITOT 0.5 10/29/2023 0809      Component Value Date/Time   TSH 2.590 05/17/2023 1101   Nutritional Lab Results  Component Value Date   VD25OH 66.5 10/29/2023   VD25OH 43.9 05/17/2023   VD25OH 34.02 01/31/2023    Attestations:   I, Damien Blanks, acting as a Stage manager for Susan Jenkins, DO., have compiled all relevant documentation for today's office visit on behalf of Susan Jenkins, DO, while in the presence of [employer], DO.  I have spent 40 minutes in the care of the patient today including 35 minutes face-to-face assessing and reviewing listed medical problems above as outlined in office visit note and providing nutritional and behavioral counseling as outlined in obesity care plan.   I have reviewed the above documentation for accuracy and completeness, and I agree with the  above. Susan Eaton, D.O.  The 21st Century Cures Act was signed into law in 2016 which includes the topic of electronic health records.  This provides immediate access to information in MyChart.  This includes consultation notes, operative notes, office notes,  lab results and pathology reports.  If you have any questions about what you read please let us  know at your next visit so we can discuss your concerns and take corrective action if need be.  We are right here with you.

## 2023-11-26 NOTE — Patient Instructions (Signed)
 The 10-year ASCVD risk score (Arnett DK, et al., 2019) is: 3.3%   Values used to calculate the score:     Age: 58 years     Clincally relevant sex: Female     Is Non-Hispanic African American: No     Diabetic: No     Tobacco smoker: No     Systolic Blood Pressure: 138 mmHg     Is BP treated: No     HDL Cholesterol: 65 mg/dL     Total Cholesterol: 189 mg/dL

## 2023-12-27 ENCOUNTER — Ambulatory Visit (INDEPENDENT_AMBULATORY_CARE_PROVIDER_SITE_OTHER): Admitting: Family Medicine

## 2024-01-24 ENCOUNTER — Ambulatory Visit (INDEPENDENT_AMBULATORY_CARE_PROVIDER_SITE_OTHER): Payer: Self-pay | Admitting: Family Medicine

## 2024-02-01 ENCOUNTER — Ambulatory Visit (INDEPENDENT_AMBULATORY_CARE_PROVIDER_SITE_OTHER): Payer: BC Managed Care – PPO | Admitting: Family

## 2024-02-01 VITALS — BP 142/84 | HR 72 | Temp 97.3°F | Ht 63.0 in | Wt 227.2 lb

## 2024-02-01 DIAGNOSIS — Z23 Encounter for immunization: Secondary | ICD-10-CM | POA: Diagnosis not present

## 2024-02-01 DIAGNOSIS — R030 Elevated blood-pressure reading, without diagnosis of hypertension: Secondary | ICD-10-CM

## 2024-02-01 DIAGNOSIS — Z0000 Encounter for general adult medical examination without abnormal findings: Secondary | ICD-10-CM

## 2024-02-01 NOTE — Addendum Note (Signed)
 Addended byBETHA LUCIUS KRABBE on: 02/01/2024 09:45 AM   Modules accepted: Level of Service

## 2024-02-01 NOTE — Progress Notes (Signed)
 Phone [phone]  Subjective:   Patient is a 58 y.o. female presenting for annual physical.    Chief Complaint  Patient presents with   Annual Exam    Fasting w/ labs   See problem oriented charting- ROS- full  review of systems was completed and negative.  The following were reviewed and entered/updated in epic: Past Medical History:  Diagnosis Date   Annual physical exam 01/26/2021   Bilateral swelling of feet 05/17/2023   Need for immunization against influenza 01/26/2021   Persistent cough 01/26/2021   Patient Active Problem List   Diagnosis Date Noted   Other hyperlipidemia 09/19/2023   Visceral obesity 09/19/2023   Bilateral swelling of feet 05/17/2023   High serum low-density lipoprotein (LDL) 01/31/2023   Annual physical exam 01/26/2021   Morbid obesity (HCC) 01/26/2021   Vitamin D  deficiency 12/26/2018   Prediabetes 12/25/2018   Family history of breast cancer in mother 12/25/2018   Hammer toes, bilateral 12/25/2018   Past Surgical History:  Procedure Laterality Date   prolapsed colon  2000   Family History  Problem Relation Age of Onset   Breast cancer Mother    Medications- reviewed and updated Current Outpatient Medications  Medication Sig Dispense Refill   cholecalciferol (VITAMIN D3) 25 MCG (1000 UNIT) tablet 6,000 international unit M-F     No current facility-administered medications for this visit.    Allergies-reviewed and updated No Known Allergies  Social History   Social History Narrative   Not on file    Objective:  BP (!) 142/84 (BP Location: Left Arm, Patient Position: Sitting, Cuff Size: Large)   Pulse 72   Temp (!) 97.3 F (36.3 C) (Temporal)   Ht 5' 3 (1.6 m)   Wt 227 lb 3.2 oz (103.1 kg)   SpO2 98%   BMI 40.25 kg/m  Physical Exam Vitals and nursing note reviewed.  Constitutional:      Appearance: Normal appearance. She is obese.  HENT:     Head: Normocephalic.     Right Ear: Tympanic membrane normal.      Left Ear: Tympanic membrane normal.     Nose: Nose normal.     Mouth/Throat:     Mouth: Mucous membranes are moist.  Eyes:     Pupils: Pupils are equal, round, and reactive to light.  Cardiovascular:     Rate and Rhythm: Normal rate and regular rhythm.  Pulmonary:     Effort: Pulmonary effort is normal.     Breath sounds: Normal breath sounds.  Musculoskeletal:        General: Normal range of motion.     Cervical back: Normal range of motion.  Lymphadenopathy:     Cervical: No cervical adenopathy.  Skin:    General: Skin is warm and dry.  Neurological:     Mental Status: She is alert.  Psychiatric:        Mood and Affect: Mood normal.        Behavior: Behavior normal.     Assessment and Plan   Health Maintenance counseling: 1. Anticipatory guidance: Patient counseled regarding regular dental exams q6 months, eye exams,  avoiding smoking and second hand smoke, limiting alcohol to 1 beverage per day, no illicit drugs.   2. Risk factor reduction:  Advised patient of need for regular exercise and diet rich with fruits and vegetables to reduce risk of heart attack and stroke. Exercise- none.  Wt Readings from Last 3 Encounters:  02/01/24 227 lb 3.2 oz (103.1 kg)  11/26/23 224 lb (101.6 kg)  10/29/23 228 lb (103.4 kg)   3. Immunizations/screenings/ancillary studies Immunization History  Administered Date(s) Administered   Influenza, Seasonal, Injecte, Preservative Fre 01/31/2023   Influenza,inj,Quad PF,6+ Mos 01/21/2015, 02/14/2016, 02/14/2017, 02/19/2018, 12/25/2018, 01/26/2021, 01/27/2022   PFIZER(Purple Top)SARS-COV-2 Vaccination 06/14/2019, 07/02/2019, 02/13/2020   Tdap 01/21/2015   Unspecified SARS-COV-2 Vaccination 02/13/2020   Zoster Recombinant(Shingrix) 02/19/2018, 05/31/2018   Health Maintenance Due  Topic Date Due   Pneumococcal Vaccine: 50+ Years (1 of 1 - PCV) Never done   Influenza Vaccine  10/19/2023   COVID-19 Vaccine (5 - 2025-26 season) 11/19/2023    4.  Cervical cancer screening-  10/2019 - normal 5. Breast cancer screening-  mammogram: 08/2021, scheduled for 02/2023 6. Colon cancer screening - 2016 - normal 7. Skin cancer screening- advised regular sunscreen use. Denies worrisome, changing, or new skin lesions.  8. Birth control/STD check- N/A 9. Osteoporosis screening- 2021 - normal 10. Alcohol screening: rare 11. Smoking associated screening (lung cancer screening, AAA screen 65-75, UA)- non- smoker 12. Exercise - none, walks occasionally  Assessment & Plan Obesity She has lost 30lbs since last year & is working towards a goal of losing 30 more pounds, attending [facility] for dietary and exercise guidance, and facing challenges with exercise motivation and hydration. - Encouraged regular exercise, even 15-20 minutes, also for cardiovascular health, stress reduction, sleep improvement, and dementia prevention. - Advised increasing morning water intake to counteract dehydration from coffee, taking sips all day long.  Prediabetes A1c is 5.4, indicating no prediabetes. She has lost weight and improved her diet,  is aware of the impact of white carbohydrates on blood sugar and advised to switch to whole grain or sourdough bread.  - Continue eating a low carb diet avoiding sweets - Increase cardio exercise as able daily. - Advised dietary modifications to reduce white carbohydrate intake.  High LDL LDL cholesterol is slightly elevated but improved from last year. Other cholesterol levels are normal. - Continue monitoring cholesterol levels.  General Health Maintenance She is due for a flu shot, last  DEXA scan 2021 normal, and mammogram is up to date until next year. - Administered flu shot. - Recommend scheduling DEXA scan for next year. - Continue routine health maintenance screenings.   Recommended follow up: Return for any future concerns, Complete physical w/fasting labs. Future Appointments  Date Time Provider  Department Center  03/25/2024  4:00 PM Midge Sober, DO [facility] None    Lucius Krabbe, NP

## 2024-02-01 NOTE — Patient Instructions (Addendum)
 It was very nice to see you today!    You look great! Stay well! Exercise more!   Exercise will also help reduce your blood pressure. Reduce your stress best you can by doing healthy activities for yourself - yoga, brisk walking, meditation, listening to music, getting together with a friend. Reduce the sodium in your diet and drink at least 2.5 liters water every day.  Have a wonderful holiday!     PLEASE NOTE:  If you had any lab tests please let us  know if you have not heard back within a few days. You may see your results on MyChart before we have a chance to review them but we will give you a call once they are reviewed by us . If we ordered any referrals today, please let us  know if you have not heard from their office within the next week.

## 2024-03-25 ENCOUNTER — Ambulatory Visit (INDEPENDENT_AMBULATORY_CARE_PROVIDER_SITE_OTHER): Admitting: Family Medicine

## 2024-04-03 ENCOUNTER — Encounter: Payer: Self-pay | Admitting: Family

## 2024-04-03 NOTE — Telephone Encounter (Signed)
 LVM for Patient to schedule OV to discuss Referral

## 2025-02-04 ENCOUNTER — Encounter: Admitting: Family
